# Patient Record
Sex: Female | Born: 1960
Health system: Southern US, Community
[De-identification: ages and names within clinical notes are randomized; demographics above are authoritative.]

## PROBLEM LIST (undated history)

## (undated) DIAGNOSIS — I639 Cerebral infarction, unspecified: Secondary | ICD-10-CM

---

## 2001-01-06 ENCOUNTER — Other Ambulatory Visit: Admission: RE | Admit: 2001-01-06 | Discharge: 2001-01-06 | Payer: Self-pay | Admitting: Obstetrics and Gynecology

## 2001-04-04 ENCOUNTER — Ambulatory Visit (HOSPITAL_COMMUNITY): Admission: RE | Admit: 2001-04-04 | Discharge: 2001-04-04 | Payer: Self-pay | Admitting: Obstetrics and Gynecology

## 2001-04-04 ENCOUNTER — Encounter: Payer: Self-pay | Admitting: Obstetrics and Gynecology

## 2001-08-21 ENCOUNTER — Inpatient Hospital Stay (HOSPITAL_COMMUNITY): Admission: AD | Admit: 2001-08-21 | Discharge: 2001-08-24 | Payer: Self-pay | Admitting: Obstetrics and Gynecology

## 2002-04-03 ENCOUNTER — Other Ambulatory Visit: Admission: RE | Admit: 2002-04-03 | Discharge: 2002-04-03 | Payer: Self-pay | Admitting: Obstetrics and Gynecology

## 2003-02-10 ENCOUNTER — Encounter: Payer: Self-pay | Admitting: Obstetrics and Gynecology

## 2003-02-10 ENCOUNTER — Encounter: Admission: RE | Admit: 2003-02-10 | Discharge: 2003-02-10 | Payer: Self-pay | Admitting: Obstetrics and Gynecology

## 2003-09-02 ENCOUNTER — Other Ambulatory Visit: Admission: RE | Admit: 2003-09-02 | Discharge: 2003-09-02 | Payer: Self-pay | Admitting: Obstetrics and Gynecology

## 2004-02-15 ENCOUNTER — Encounter: Admission: RE | Admit: 2004-02-15 | Discharge: 2004-02-15 | Payer: Self-pay | Admitting: Obstetrics and Gynecology

## 2004-11-22 ENCOUNTER — Other Ambulatory Visit: Admission: RE | Admit: 2004-11-22 | Discharge: 2004-11-22 | Payer: Self-pay | Admitting: Obstetrics and Gynecology

## 2005-02-19 ENCOUNTER — Encounter: Admission: RE | Admit: 2005-02-19 | Discharge: 2005-02-19 | Payer: Self-pay | Admitting: Obstetrics and Gynecology

## 2006-02-25 ENCOUNTER — Encounter: Admission: RE | Admit: 2006-02-25 | Discharge: 2006-02-25 | Payer: Self-pay | Admitting: Obstetrics and Gynecology

## 2006-03-13 ENCOUNTER — Other Ambulatory Visit: Admission: RE | Admit: 2006-03-13 | Discharge: 2006-03-13 | Payer: Self-pay | Admitting: Obstetrics and Gynecology

## 2007-07-15 ENCOUNTER — Encounter: Admission: RE | Admit: 2007-07-15 | Discharge: 2007-07-15 | Payer: Self-pay | Admitting: Obstetrics and Gynecology

## 2008-07-27 ENCOUNTER — Encounter: Admission: RE | Admit: 2008-07-27 | Discharge: 2008-07-27 | Payer: Self-pay | Admitting: Obstetrics and Gynecology

## 2008-12-30 ENCOUNTER — Encounter: Admission: RE | Admit: 2008-12-30 | Discharge: 2008-12-30 | Payer: Self-pay | Admitting: Internal Medicine

## 2010-09-24 ENCOUNTER — Encounter: Payer: Self-pay | Admitting: Obstetrics and Gynecology

## 2011-01-19 NOTE — H&P (Signed)
[facility]  PatientLibertie Casey, [place] Visit Number: [id number] MRN: [id number]          Service Type: Attending:  Naima A. Normand Sloop, M.D. Dictated by:   Nigel Bridgeman, C.N.M. Adm. Date:  08/21/01                           History and Physical  HISTORY:                      Michelle Casey is a 50 year old gravida 2, para 1-0-0-1 at 34 1/7 weeks who presents today in labor from the office.  She was seen in the office earlier today and was 2 cm.  She was rechecked later this afternoon and was now 3-4.  She had a small amount of bleeding this morning. Pregnancy has been remarkable for first trimester bleeding, history of hyperthyroidism, history of postpartum depression, fibroid, advanced maternal age.  PRENATAL LABORATORIES:        Blood type AB+.  Rh antibody negative.  VDRL nonreactive.  Rubella titer positive.  Hepatitis B surface antigen negative. Sickle cell test negative.  GC and chlamydia cultures were negative.  Pap was normal.  TSH was normal.  Glucose challenge was normal.  Amniocentesis was normal.  Group B STrep culture was negative at 36 weeks.  HISTORY OF PRESENT PREGNANCY: Patient entered care at approximately 10 weeks. She elected to have an amniocentesis.  Her TSHs were within normal limits. She had a marginal previa at the time of her amniocentesis.  Her amniocentesis showed normal female chromosomes.  She had another ultrasound at 21 weeks that showed normal growth and development.  She had a 5 cm fibroid noted.  The rest of her pregnancy was uncomplicated.  PAST OBSTETRICAL HISTORY:     In 1998 she had the vaginal birth of a female infant.  Weight 8 pounds 14 ounces at [redacted] weeks gestation.  She was in labor 12 hours.  She had a vaginal birth.  She had some first trimester bleeding.  She also had some postpartum depression that lasted one month.  PAST MEDICAL HISTORY:         She was a condom user.  She has a history of hyperthyroidism  diagnosed in 43.  She has been on medications on and off, last time was in 1998.  She does have a history of postpartum depression for one month.  Only other hospitalization was for childbirth.  ALLERGIES:                    She has sensitivity to some type of hyperthyroid medicine which causes her itching.  FAMILY HISTORY:               Her father is hypertensive.  Her father also had prostate and bladder cancer.  Her father was a previous smoker.  GENETIC HISTORY:              Remarkable for the father of the baby having aplastic anemia in 1989.  SOCIAL HISTORY:               Patient is married to the father of the baby. He is involved and supportive.  His name is Manfred Arch.  Patient is [place], of the [place] faith.  She speaks [place], [place], and Air cabin crew.  There is minimal language barrier.  She is graduated educated.  She is employed as a  coordinator.  Her husband also has a Naval architect.  Patient denies any alcohol, drug, or tobacco use during this pregnancy.  She has been followed by the physician service at [facility].  PHYSICAL EXAMINATION  VITAL SIGNS:                  Vital signs are stable.  Patient is afebrile.  HEENT:                        Within normal limits.  LUNGS:                        Bilateral breath sounds are clear.  HEART:                        Regular rate and rhythm without murmur.  BREASTS:                      Soft and nontender.  ABDOMEN:                      Fundal height is approximately 38 cm.  Estimated fetal weight is 8-8.5 pounds.  Uterine contractions are every three to five minutes, moderate quality.  PELVIC:                       Cervix 3-4 cm by Wynelle Bourgeois in the office with vertex presenting.  Fetal heart rate is in the 140s by Doppler.  EXTREMITIES:                  Deep tendon reflexes are 2+ without clonus. There is a trace edema noted.  IMPRESSION:                   1. Intrauterine pregnancy at 39  1/7 weeks.                               2. Early labor.                               3. Negative group B Strep.  PLAN:                         1. Admit to birthing suite for consult with Dr.                                  Jaymes Graff as attending physician.                               2. Routine physician orders.                               3. Anticipate normal spontaneous vaginal birth. Dictated by:   Nigel Bridgeman, C.N.M. Attending:  Naima A. Normand Sloop, M.D. DD:  08/21/01 TD:  08/21/01 Job: [id number]

## 2011-03-30 ENCOUNTER — Other Ambulatory Visit: Payer: Self-pay | Admitting: Obstetrics and Gynecology

## 2011-03-30 DIAGNOSIS — Z1231 Encounter for screening mammogram for malignant neoplasm of breast: Secondary | ICD-10-CM

## 2013-06-17 ENCOUNTER — Other Ambulatory Visit: Payer: Self-pay | Admitting: Obstetrics and Gynecology

## 2013-06-17 DIAGNOSIS — Z803 Family history of malignant neoplasm of breast: Secondary | ICD-10-CM

## 2014-04-28 ENCOUNTER — Other Ambulatory Visit: Payer: Self-pay

## 2014-04-29 ENCOUNTER — Other Ambulatory Visit: Payer: Self-pay

## 2014-04-29 DIAGNOSIS — Z1231 Encounter for screening mammogram for malignant neoplasm of breast: Secondary | ICD-10-CM

## 2014-05-07 ENCOUNTER — Ambulatory Visit
Admission: RE | Admit: 2014-05-07 | Discharge: 2014-05-07 | Disposition: A | Discharge status: Home / Self Care | Payer: PRIVATE HEALTH INSURANCE | Source: Ambulatory Visit

## 2014-05-07 DIAGNOSIS — Z1231 Encounter for screening mammogram for malignant neoplasm of breast: Secondary | ICD-10-CM

## 2014-05-14 ENCOUNTER — Other Ambulatory Visit: Payer: Self-pay | Admitting: Gastroenterology

## 2014-06-30 ENCOUNTER — Other Ambulatory Visit: Payer: Self-pay | Admitting: Obstetrics and Gynecology

## 2014-07-02 LAB — CYTOLOGY - PAP

## 2015-03-30 ENCOUNTER — Encounter (INDEPENDENT_AMBULATORY_CARE_PROVIDER_SITE_OTHER): Payer: Self-pay | Admitting: Ophthalmology

## 2015-03-31 ENCOUNTER — Encounter (INDEPENDENT_AMBULATORY_CARE_PROVIDER_SITE_OTHER): Payer: Self-pay | Admitting: Ophthalmology

## 2015-03-31 ENCOUNTER — Encounter (INDEPENDENT_AMBULATORY_CARE_PROVIDER_SITE_OTHER): Payer: Commercial Managed Care - PPO | Admitting: Ophthalmology

## 2015-03-31 DIAGNOSIS — H33302 Unspecified retinal break, left eye: Secondary | ICD-10-CM | POA: Diagnosis not present

## 2015-03-31 DIAGNOSIS — H43813 Vitreous degeneration, bilateral: Secondary | ICD-10-CM | POA: Diagnosis not present

## 2015-03-31 DIAGNOSIS — H4423 Degenerative myopia, bilateral: Secondary | ICD-10-CM

## 2015-04-14 ENCOUNTER — Ambulatory Visit (INDEPENDENT_AMBULATORY_CARE_PROVIDER_SITE_OTHER): Payer: Commercial Managed Care - PPO | Admitting: Ophthalmology

## 2015-04-14 DIAGNOSIS — H33302 Unspecified retinal break, left eye: Secondary | ICD-10-CM

## 2015-08-15 ENCOUNTER — Ambulatory Visit (INDEPENDENT_AMBULATORY_CARE_PROVIDER_SITE_OTHER): Payer: Commercial Managed Care - PPO | Admitting: Ophthalmology

## 2018-02-14 ENCOUNTER — Encounter (INDEPENDENT_AMBULATORY_CARE_PROVIDER_SITE_OTHER): Payer: Commercial Managed Care - PPO | Admitting: Ophthalmology

## 2018-02-14 DIAGNOSIS — H4423 Degenerative myopia, bilateral: Secondary | ICD-10-CM

## 2018-02-14 DIAGNOSIS — H43813 Vitreous degeneration, bilateral: Secondary | ICD-10-CM

## 2018-02-14 DIAGNOSIS — H33302 Unspecified retinal break, left eye: Secondary | ICD-10-CM

## 2019-02-27 ENCOUNTER — Encounter (INDEPENDENT_AMBULATORY_CARE_PROVIDER_SITE_OTHER): Payer: Commercial Managed Care - PPO | Admitting: Ophthalmology

## 2019-12-18 ENCOUNTER — Ambulatory Visit: Payer: PRIVATE HEALTH INSURANCE | Attending: Internal Medicine

## 2019-12-18 DIAGNOSIS — Z23 Encounter for immunization: Secondary | ICD-10-CM

## 2019-12-18 NOTE — Progress Notes (Signed)
   Covid-19 Vaccination Clinic  Name:  Eryn Lough    MRN: [id number] DOB: 06-Oct-1960  12/18/2019  Ms. Dabney was observed post Covid-19 immunization for 15 minutes without incident. She was provided with Vaccine Information Sheet and instruction to access the V-Safe system.   Ms. Brunn was instructed to call 911 with any severe reactions post vaccine: Marland Kitchen Difficulty breathing  . Swelling of face and throat  . A fast heartbeat  . A bad rash all over body  . Dizziness and weakness   Immunizations Administered    Name Date Dose VIS Date Route   Pfizer COVID-19 Vaccine 12/18/2019  9:15 AM 0.3 mL 08/14/2019 Intramuscular   Manufacturer: [employer], [employer]   Lot: [id number]   NDC: [address]-8

## 2020-01-11 ENCOUNTER — Ambulatory Visit: Payer: PRIVATE HEALTH INSURANCE | Attending: Internal Medicine

## 2020-01-11 DIAGNOSIS — Z23 Encounter for immunization: Secondary | ICD-10-CM

## 2020-01-11 NOTE — Progress Notes (Signed)
   Covid-19 Vaccination Clinic  Name:  Claritza Tappan    MRN: [id number] DOB: 1961/06/08  01/11/2020  Ms. Ketcher was observed post Covid-19 immunization for 15 minutes without incident. She was provided with Vaccine Information Sheet and instruction to access the V-Safe system.   Ms. Coppola was instructed to call 911 with any severe reactions post vaccine: Marland Kitchen Difficulty breathing  . Swelling of face and throat  . A fast heartbeat  . A bad rash all over body  . Dizziness and weakness   Immunizations Administered    Name Date Dose VIS Date Route   Pfizer COVID-19 Vaccine 01/11/2020  9:34 AM 0.3 mL 10/28/2018 Intramuscular   Manufacturer: [employer], [employer]   Lot: [id number]   NDC: [address]-2

## 2020-05-16 DIAGNOSIS — E039 Hypothyroidism, unspecified: Secondary | ICD-10-CM | POA: Diagnosis present

## 2020-05-16 HISTORY — DX: Hypothyroidism, unspecified: E03.9

## 2021-01-19 ENCOUNTER — Encounter (HOSPITAL_BASED_OUTPATIENT_CLINIC_OR_DEPARTMENT_OTHER): Payer: Self-pay | Admitting: *Deleted

## 2021-01-19 ENCOUNTER — Other Ambulatory Visit: Payer: Self-pay

## 2021-01-19 DIAGNOSIS — Z8673 Personal history of transient ischemic attack (TIA), and cerebral infarction without residual deficits: Secondary | ICD-10-CM

## 2021-01-19 DIAGNOSIS — R7989 Other specified abnormal findings of blood chemistry: Secondary | ICD-10-CM | POA: Diagnosis present

## 2021-01-19 DIAGNOSIS — E871 Hypo-osmolality and hyponatremia: Secondary | ICD-10-CM | POA: Diagnosis present

## 2021-01-19 DIAGNOSIS — E860 Dehydration: Secondary | ICD-10-CM | POA: Diagnosis present

## 2021-01-19 DIAGNOSIS — U071 COVID-19: Principal | ICD-10-CM | POA: Diagnosis present

## 2021-01-19 DIAGNOSIS — I959 Hypotension, unspecified: Secondary | ICD-10-CM | POA: Diagnosis present

## 2021-01-19 DIAGNOSIS — E063 Autoimmune thyroiditis: Secondary | ICD-10-CM | POA: Diagnosis present

## 2021-01-19 DIAGNOSIS — E039 Hypothyroidism, unspecified: Secondary | ICD-10-CM | POA: Diagnosis present

## 2021-01-19 DIAGNOSIS — Z888 Allergy status to other drugs, medicaments and biological substances status: Secondary | ICD-10-CM

## 2021-01-19 DIAGNOSIS — Z79890 Hormone replacement therapy (postmenopausal): Secondary | ICD-10-CM

## 2021-01-19 NOTE — ED Triage Notes (Signed)
Pt reports Covid + home test today , daughter at home with same, fever , chills

## 2021-01-20 ENCOUNTER — Encounter (HOSPITAL_COMMUNITY): Payer: Self-pay | Admitting: Family Medicine

## 2021-01-20 ENCOUNTER — Inpatient Hospital Stay (HOSPITAL_BASED_OUTPATIENT_CLINIC_OR_DEPARTMENT_OTHER)
Admission: EM | Admit: 2021-01-20 | Discharge: 2021-01-24 | DRG: 178 | Disposition: A | Discharge status: Home / Self Care | Payer: Managed Care, Other (non HMO) | Attending: Internal Medicine | Admitting: Internal Medicine

## 2021-01-20 ENCOUNTER — Emergency Department (HOSPITAL_BASED_OUTPATIENT_CLINIC_OR_DEPARTMENT_OTHER): Payer: Managed Care, Other (non HMO)

## 2021-01-20 DIAGNOSIS — E860 Dehydration: Secondary | ICD-10-CM | POA: Diagnosis present

## 2021-01-20 DIAGNOSIS — R790 Abnormal level of blood mineral: Secondary | ICD-10-CM | POA: Diagnosis not present

## 2021-01-20 DIAGNOSIS — E063 Autoimmune thyroiditis: Secondary | ICD-10-CM | POA: Diagnosis present

## 2021-01-20 DIAGNOSIS — E039 Hypothyroidism, unspecified: Secondary | ICD-10-CM

## 2021-01-20 DIAGNOSIS — Z8673 Personal history of transient ischemic attack (TIA), and cerebral infarction without residual deficits: Secondary | ICD-10-CM | POA: Diagnosis not present

## 2021-01-20 DIAGNOSIS — I959 Hypotension, unspecified: Secondary | ICD-10-CM | POA: Diagnosis present

## 2021-01-20 DIAGNOSIS — E871 Hypo-osmolality and hyponatremia: Secondary | ICD-10-CM | POA: Diagnosis present

## 2021-01-20 DIAGNOSIS — U071 COVID-19: Secondary | ICD-10-CM | POA: Diagnosis present

## 2021-01-20 DIAGNOSIS — Z888 Allergy status to other drugs, medicaments and biological substances status: Secondary | ICD-10-CM | POA: Diagnosis not present

## 2021-01-20 DIAGNOSIS — Z79890 Hormone replacement therapy (postmenopausal): Secondary | ICD-10-CM | POA: Diagnosis not present

## 2021-01-20 DIAGNOSIS — R7989 Other specified abnormal findings of blood chemistry: Secondary | ICD-10-CM | POA: Diagnosis present

## 2021-01-20 DIAGNOSIS — R945 Abnormal results of liver function studies: Secondary | ICD-10-CM

## 2021-01-20 HISTORY — DX: Autoimmune thyroiditis: E06.3

## 2021-01-20 HISTORY — DX: Cerebral infarction, unspecified: I63.9

## 2021-01-20 LAB — BASIC METABOLIC PANEL
Anion gap: 12 (ref 5–15)
Anion gap: 5 (ref 5–15)
Anion gap: 9 (ref 5–15)
Anion gap: 5 (ref 5–15)
BUN: 11 mg/dL (ref 6–20)
BUN: 9 mg/dL (ref 6–20)
BUN: 8 mg/dL (ref 6–20)
BUN: 9 mg/dL (ref 6–20)
CO2: 17 mmol/L — ABNORMAL LOW (ref 22–32)
CO2: 20 mmol/L — ABNORMAL LOW (ref 22–32)
CO2: 15 mmol/L — ABNORMAL LOW (ref 22–32)
CO2: 22 mmol/L (ref 22–32)
Calcium: 8.9 mg/dL (ref 8.9–10.3)
Calcium: 8.1 mg/dL — ABNORMAL LOW (ref 8.9–10.3)
Calcium: 8.1 mg/dL — ABNORMAL LOW (ref 8.9–10.3)
Calcium: 8.2 mg/dL — ABNORMAL LOW (ref 8.9–10.3)
Chloride: 85 mmol/L — ABNORMAL LOW (ref 98–111)
Chloride: 95 mmol/L — ABNORMAL LOW (ref 98–111)
Chloride: 90 mmol/L — ABNORMAL LOW (ref 98–111)
Chloride: 92 mmol/L — ABNORMAL LOW (ref 98–111)
Creatinine, Ser: 0.86 mg/dL (ref 0.44–1.00)
Creatinine, Ser: 0.6 mg/dL (ref 0.44–1.00)
Creatinine, Ser: 0.46 mg/dL (ref 0.44–1.00)
Creatinine, Ser: 0.7 mg/dL (ref 0.44–1.00)
GFR, Estimated: >60 mL/min (ref >60)
GFR, Estimated: >60 mL/min (ref >60)
GFR, Estimated: >60 mL/min (ref >60)
GFR, Estimated: >60 mL/min (ref >60)
Glucose, Bld: 85 mg/dL (ref 70–99)
Glucose, Bld: 87 mg/dL (ref 70–99)
Glucose, Bld: 80 mg/dL (ref 70–99)
Glucose, Bld: 88 mg/dL (ref 70–99)
Potassium: 3.6 mmol/L (ref 3.5–5.1)
Potassium: 3.7 mmol/L (ref 3.5–5.1)
Potassium: 3.7 mmol/L (ref 3.5–5.1)
Potassium: 3.5 mmol/L (ref 3.5–5.1)
Sodium: 114 mmol/L — CL (ref 135–145)
Sodium: 120 mmol/L — ABNORMAL LOW (ref 135–145)
Sodium: 114 mmol/L — CL (ref 135–145)
Sodium: 119 mmol/L — CL (ref 135–145)

## 2021-01-20 LAB — HIV ANTIBODY (ROUTINE TESTING W REFLEX): HIV Screen 4th Generation wRfx: NONREACTIVE

## 2021-01-20 LAB — FIBRINOGEN: Fibrinogen: 506 mg/dL — ABNORMAL HIGH (ref 210–475)

## 2021-01-20 LAB — CBC WITH DIFFERENTIAL/PLATELET
Abs Immature Granulocytes: 0.01 10*3/uL (ref 0.00–0.07)
Basophils Absolute: 0 10*3/uL (ref 0.0–0.1)
Basophils Relative: 0 %
Eosinophils Absolute: 0 10*3/uL (ref 0.0–0.5)
Eosinophils Relative: 1 %
HCT: 37.2 % (ref 36.0–46.0)
Hemoglobin: 13.4 g/dL (ref 12.0–15.0)
Immature Granulocytes: 0 %
Lymphocytes Relative: 25 %
Lymphs Abs: 1.4 10*3/uL (ref 0.7–4.0)
MCH: 29.9 pg (ref 26.0–34.0)
MCHC: 36 g/dL (ref 30.0–36.0)
MCV: 83 fL (ref 80.0–100.0)
Monocytes Absolute: 0.9 10*3/uL (ref 0.1–1.0)
Monocytes Relative: 15 %
Neutro Abs: 3.3 10*3/uL (ref 1.7–7.7)
Neutrophils Relative %: 59 %
Platelets: 210 10*3/uL (ref 150–400)
RBC: 4.48 MIL/uL (ref 3.87–5.11)
RDW: 11.8 % (ref 11.5–15.5)
WBC: 5.6 10*3/uL (ref 4.0–10.5)
nRBC: 0 % (ref 0.0–0.2)

## 2021-01-20 LAB — IRON AND TIBC
Iron: 32 ug/dL (ref 28–170)
Saturation Ratios: 11 % (ref 10.4–31.8)
TIBC: 300 ug/dL (ref 250–450)
UIBC: 268 ug/dL

## 2021-01-20 LAB — SODIUM, URINE, RANDOM: Sodium, Ur: 86 mmol/L

## 2021-01-20 LAB — HEPATIC FUNCTION PANEL
ALT: 31 U/L (ref 0–44)
AST: 25 U/L (ref 15–41)
Albumin: 3.3 g/dL — ABNORMAL LOW (ref 3.5–5.0)
Alkaline Phosphatase: 83 U/L (ref 38–126)
Bilirubin, Direct: 0.1 mg/dL (ref 0.0–0.2)
Indirect Bilirubin: 0.7 mg/dL (ref 0.3–0.9)
Total Bilirubin: 0.8 mg/dL (ref 0.3–1.2)
Total Protein: 5.6 g/dL — ABNORMAL LOW (ref 6.5–8.1)

## 2021-01-20 LAB — LACTATE DEHYDROGENASE: LDH: 172 U/L (ref 98–192)

## 2021-01-20 LAB — OSMOLALITY: Osmolality: 245 mOsm/kg — CL (ref 275–295)

## 2021-01-20 LAB — BLOOD GAS, VENOUS
Acid-base deficit: 5.1 mmol/L — ABNORMAL HIGH (ref 0.0–2.0)
Bicarbonate: 18.2 mmol/L — ABNORMAL LOW (ref 20.0–28.0)
FIO2: 21
O2 Saturation: 45.2 %
Patient temperature: 98
pCO2, Ven: 29.6 mmHg — ABNORMAL LOW (ref 44.0–60.0)
pH, Ven: 7.404 (ref 7.250–7.430)
pO2, Ven: <31 mmHg — CL (ref 32.0–45.0)

## 2021-01-20 LAB — C-REACTIVE PROTEIN: CRP: 0.5 mg/dL (ref <1.0)

## 2021-01-20 LAB — RESP PANEL BY RT-PCR (FLU A&B, COVID) ARPGX2
Influenza A by PCR: NEGATIVE
Influenza B by PCR: NEGATIVE
SARS Coronavirus 2 by RT PCR: POSITIVE — AB

## 2021-01-20 LAB — OSMOLALITY, URINE: Osmolality, Ur: 408 mOsm/kg (ref 300–900)

## 2021-01-20 LAB — TRIGLYCERIDES: Triglycerides: 77 mg/dL (ref <150)

## 2021-01-20 LAB — PREGNANCY, URINE: Preg Test, Ur: NEGATIVE

## 2021-01-20 LAB — D-DIMER, QUANTITATIVE: D-Dimer, Quant: 0.31 ug/mL-FEU (ref 0.00–0.50)

## 2021-01-20 LAB — FERRITIN: Ferritin: 216 ng/mL (ref 11–307)

## 2021-01-20 LAB — TSH: TSH: 1.049 u[IU]/mL (ref 0.350–4.500)

## 2021-01-20 LAB — LACTIC ACID, PLASMA: Lactic Acid, Venous: 0.9 mmol/L (ref 0.5–1.9)

## 2021-01-20 LAB — PROCALCITONIN: Procalcitonin: <0.1 ng/mL

## 2021-01-20 MED ORDER — SODIUM CHLORIDE 0.9 % IV SOLN: INTRAVENOUS | Status: DC

## 2021-01-20 MED ORDER — ACETAMINOPHEN 325 MG PO TABS
650.0000 mg | ORAL_TABLET | Freq: Once | ORAL | Status: AC
  Administered 2021-01-20: 650 mg via ORAL
  Filled 2021-01-20: qty 2

## 2021-01-20 MED ORDER — SODIUM CHLORIDE 0.9 % IV BOLUS
1000.0000 mL | Freq: Once | INTRAVENOUS | Status: AC
  Administered 2021-01-20: 1000 mL via INTRAVENOUS

## 2021-01-20 MED ORDER — ENOXAPARIN SODIUM 40 MG/0.4ML IJ SOSY
40.0000 mg | PREFILLED_SYRINGE | INTRAMUSCULAR | Status: DC
  Administered 2021-01-20 – 2021-01-24 (×5): 40 mg via SUBCUTANEOUS
  Filled 2021-01-20 (×5): qty 0.4

## 2021-01-20 MED ORDER — ENSURE ENLIVE PO LIQD: 237.0000 mL | Freq: Two times a day (BID) | ORAL | Status: DC

## 2021-01-20 MED ORDER — IBUPROFEN 400 MG PO TABS
600.0000 mg | ORAL_TABLET | Freq: Once | ORAL | Status: AC
  Administered 2021-01-20: 600 mg via ORAL
  Filled 2021-01-20: qty 1

## 2021-01-20 MED ORDER — ONDANSETRON HCL 4 MG PO TABS
4.0000 mg | ORAL_TABLET | Freq: Four times a day (QID) | ORAL | Status: DC | PRN
  Administered 2021-01-20: 4 mg via ORAL
  Filled 2021-01-20: qty 1

## 2021-01-20 MED ORDER — SODIUM CHLORIDE 0.9 % IV SOLN
100.0000 mg | Freq: Once | INTRAVENOUS | Status: AC
  Administered 2021-01-20: 100 mg via INTRAVENOUS

## 2021-01-20 MED ORDER — LEVOTHYROXINE SODIUM 100 MCG PO TABS
100.0000 ug | ORAL_TABLET | Freq: Every day | ORAL | Status: DC
  Administered 2021-01-20 – 2021-01-24 (×5): 100 ug via ORAL
  Filled 2021-01-20 (×5): qty 1

## 2021-01-20 MED ORDER — ACETAMINOPHEN 325 MG PO TABS
650.0000 mg | ORAL_TABLET | Freq: Four times a day (QID) | ORAL | Status: DC | PRN
  Administered 2021-01-20 – 2021-01-21 (×3): 650 mg via ORAL
  Filled 2021-01-20 (×3): qty 2

## 2021-01-20 MED ORDER — ONDANSETRON HCL 4 MG/2ML IJ SOLN: 4.0000 mg | Freq: Four times a day (QID) | INTRAMUSCULAR | Status: DC | PRN

## 2021-01-20 MED ORDER — GUAIFENESIN-DM 100-10 MG/5ML PO SYRP: 10.0000 mL | ORAL_SOLUTION | ORAL | Status: DC | PRN

## 2021-01-20 MED ORDER — FLUTICASONE PROPIONATE 50 MCG/ACT NA SUSP
2.0000 | Freq: Every day | NASAL | Status: DC
  Administered 2021-01-20 – 2021-01-24 (×5): 2 via NASAL
  Filled 2021-01-20: qty 16

## 2021-01-20 MED ORDER — ALBUTEROL SULFATE HFA 108 (90 BASE) MCG/ACT IN AERS: 2.0000 | INHALATION_SPRAY | RESPIRATORY_TRACT | Status: DC | PRN

## 2021-01-20 MED ORDER — ZINC SULFATE 220 (50 ZN) MG PO CAPS
220.0000 mg | ORAL_CAPSULE | Freq: Every day | ORAL | Status: DC
  Administered 2021-01-20 – 2021-01-24 (×5): 220 mg via ORAL
  Filled 2021-01-20 (×5): qty 1

## 2021-01-20 MED ORDER — MELATONIN 5 MG PO TABS: 10.0000 mg | ORAL_TABLET | Freq: Every evening | ORAL | Status: DC | PRN

## 2021-01-20 MED ORDER — ENSURE ENLIVE PO LIQD
237.0000 mL | Freq: Two times a day (BID) | ORAL | Status: DC
  Administered 2021-01-20 – 2021-01-23 (×7): 237 mL via ORAL

## 2021-01-20 MED ORDER — ADULT MULTIVITAMIN W/MINERALS CH
1.0000 | ORAL_TABLET | Freq: Every day | ORAL | Status: DC
  Administered 2021-01-20 – 2021-01-24 (×5): 1 via ORAL
  Filled 2021-01-20 (×5): qty 1

## 2021-01-20 MED ORDER — SODIUM CHLORIDE 0.9 % IV SOLN
100.0000 mg | INTRAVENOUS | Status: AC
  Administered 2021-01-20: 100 mg via INTRAVENOUS

## 2021-01-20 MED ORDER — SODIUM CHLORIDE 0.9 % IV SOLN
100.0000 mg | Freq: Every day | INTRAVENOUS | Status: AC
  Administered 2021-01-21 – 2021-01-22 (×2): 100 mg via INTRAVENOUS
  Filled 2021-01-20 (×2): qty 20

## 2021-01-20 MED ORDER — PROSOURCE PLUS PO LIQD
30.0000 mL | Freq: Every day | ORAL | Status: DC
  Administered 2021-01-20 – 2021-01-23 (×3): 30 mL via ORAL
  Filled 2021-01-20 (×2): qty 30

## 2021-01-20 MED ORDER — SODIUM BICARBONATE 650 MG PO TABS
650.0000 mg | ORAL_TABLET | Freq: Three times a day (TID) | ORAL | Status: DC
  Administered 2021-01-20 (×2): 650 mg via ORAL
  Filled 2021-01-20 (×2): qty 1

## 2021-01-20 MED ORDER — POLYETHYLENE GLYCOL 3350 17 G PO PACK: 17.0000 g | PACK | Freq: Every day | ORAL | Status: DC | PRN

## 2021-01-20 MED ORDER — ASCORBIC ACID 500 MG PO TABS
500.0000 mg | ORAL_TABLET | Freq: Every day | ORAL | Status: DC
  Administered 2021-01-20 – 2021-01-24 (×5): 500 mg via ORAL
  Filled 2021-01-20 (×5): qty 1

## 2021-01-20 MED ORDER — MENTHOL 3 MG MT LOZG
1.0000 | LOZENGE | OROMUCOSAL | Status: DC | PRN
  Administered 2021-01-20: 3 mg via ORAL
  Filled 2021-01-20: qty 9

## 2021-01-20 NOTE — H&P (Signed)
History and Physical    Tommy Connon KVQ:[id number] DOB: [birth year] DOA: 01/20/2021  PCP: [facility] [place]  Patient coming from: [facility]   Chief Complaint:  Chief Complaint  Patient presents with  . Covid Positive     HPI:    60 year old female with past medical history of hypothyroidism who presents to med [facility] emergency department with complaints of shortness of breath myalgias and weakness.  Patient explains that she picked her daughter up from college this past Saturday and after doing so realized that her daughter was exhibiting  symptoms concerning for COVID.  They performed a home test on her daughter on Sunday which was found to be positive.  Patient explains that by Wednesday 5/18 the patient began to experience generalized weakness, myalgias, nonproductive cough and nausea with frequent bouts of nonbilious nonbloody vomiting.  Patient also complains of associated shortness of breath is moderate to severe in intensity worse with exertion and improved with rest.  As patient symptoms persisted patient also began to develop tingling in the digits of her bilateral hands as well as a resting tremor.  Due to this constellation of symptoms the patient eventually presented to med [facility] emergency department for evaluation.  Upon evaluation at [facility] emergency department, COVID-19 testing was found to be positive.  Surprisingly, patient was also found to exhibit substantial hyponatremia of 114.  ER provider ordered a 1 L fluid bolus which was subsequently administered.  Patient was also initiated on intravenous remdesivir.  The hospitalist group was then called and the patient was accepted for transfer to [facility] long hospital for continued medical care.    Review of Systems:   Review of Systems  Constitutional: Positive for malaise/fatigue and weight loss.  Respiratory: Positive for cough and shortness of breath.    Musculoskeletal: Positive for joint pain and myalgias.  Neurological: Positive for tingling, tremors and weakness.  All other systems reviewed and are negative.   Past Medical History:  Diagnosis Date  . Autoimmune thyroiditis 01/20/2021  . Hypothyroidism 05/16/2020  . Stroke [facility])     History reviewed. No pertinent surgical history.   reports that she has never smoked. She has never used smokeless tobacco. No history on file for alcohol use and drug use.  Allergies  Allergen Reactions  . Carbimazole Itching and Rash    Family History  Problem Relation Age of Onset  . Heart attack Neg Hx      Prior to Admission medications   Medication Sig Start Date End Date Taking? Authorizing Provider  levothyroxine (SYNTHROID) 100 MCG tablet Take 100 mcg by mouth daily.    [provider]    Physical Exam: Vitals:   01/20/21 0401 01/20/21 0413 01/20/21 0500 01/20/21 0509  BP: (!) 103/49   (!) 126/92  Pulse: 66   63  Resp: 19   18  Temp:  98.2 F (36.8 C)  98.4 F (36.9 C)  TempSrc:  Oral  Oral  SpO2: 100%   100%  Weight:   60.7 kg   Height:   5\' 7"  (1.702 m)     Constitutional: Awake alert and oriented x3, no associated distress.   Skin: no rashes, no lesions, somewhat poor skin turgor noted. Eyes: Pupils are equally reactive to light.  No evidence of scleral icterus or conjunctival pallor.  ENMT: Dry mucous membranes noted.  Posterior pharynx clear of any exudate or lesions.   Neck: normal, supple, no masses, no thyromegaly.  No evidence of jugular venous distension.   Respiratory: Faint bibasilar rales without any evidence of wheezing.   Normal respiratory effort. No accessory muscle use.  Cardiovascular: Regular rate and rhythm, no murmurs / rubs / gallops. No extremity edema. 2+ pedal pulses. No carotid bruits.  Chest:   Nontender without crepitus or deformity.   Back:   Nontender without crepitus or deformity. Abdomen: Abdomen is soft and nontender.  No  evidence of intra-abdominal masses.  Positive bowel sounds noted in all quadrants.   Musculoskeletal: No joint deformity upper and lower extremities. Good ROM, no contractures. Normal muscle tone.  Neurologic: CN 2-12 grossly intact. Sensation intact.  Patient moving all 4 extremities spontaneously.  Patient is following all commands.  Patient is responsive to verbal stimuli.   Psychiatric: Patient exhibits anxious mood with labile affect.  Patient seems to possess insight as to their current situation.     Labs on Admission: I have personally reviewed following labs and imaging studies -   CBC: Recent Labs  Lab 01/20/21 0132  WBC 5.6  NEUTROABS 3.3  HGB 13.4  HCT 37.2  MCV 83.0  PLT 210   Basic Metabolic Panel: Recent Labs  Lab 01/20/21 0132 01/20/21 0604  NA 114* 120*  K 3.6 3.7  CL 85* 95*  CO2 17* 20*  GLUCOSE 85 87  BUN 11 9  CREATININE 0.86 0.60  CALCIUM 8.9 8.1*   GFR: Estimated Creatinine Clearance: 74.3 mL/min (by C-G formula based on SCr of 0.6 mg/dL). Liver Function Tests: Recent Labs  Lab 01/20/21 0303  AST 25  ALT 31  ALKPHOS 83  BILITOT 0.8  PROT 5.6*  ALBUMIN 3.3*   No results for input(s): LIPASE, AMYLASE in the last 168 hours. No results for input(s): AMMONIA in the last 168 hours. Coagulation Profile: No results for input(s): INR, PROTIME in the last 168 hours. Cardiac Enzymes: No results for input(s): CKTOTAL, CKMB, CKMBINDEX, TROPONINI in the last 168 hours. BNP (last 3 results) No results for input(s): PROBNP in the last 8760 hours. HbA1C: No results for input(s): HGBA1C in the last 72 hours. CBG: No results for input(s): GLUCAP in the last 168 hours. Lipid Profile: Recent Labs    01/20/21 0132  TRIG 77   Thyroid Function Tests: Recent Labs    01/20/21 0604  TSH 1.049   Anemia Panel: Recent Labs    01/20/21 0303  FERRITIN 216   Urine analysis: No results found for: COLORURINE, APPEARANCEUR, LABSPEC, PHURINE, GLUCOSEU,  HGBUR, BILIRUBINUR, KETONESUR, PROTEINUR, UROBILINOGEN, NITRITE, LEUKOCYTESUR  Radiological Exams on Admission - Personally Reviewed: DG Chest Port 1 View  Result Date: 01/20/2021 CLINICAL DATA:  cough EXAM: PORTABLE CHEST 1 VIEW COMPARISON:  None. FINDINGS: The heart size and mediastinal contours are within normal limits. No focal consolidation. No visible pleural effusion or pneumothorax. The visualized skeletal structures are unremarkable. IMPRESSION: No acute cardiopulmonary disease. Electronically Signed   By: Maudry Mayhew MD   On: 01/20/2021 02:56    EKG: Personally reviewed.  Rhythm is normal sinus rhythm with heart rate of 66 bpm.  No dynamic ST segment changes appreciated.  Assessment/Plan Principal Problem:   Hyponatremia  Patient reports that she was seen by one of her outpatient providers approximately 2 weeks ago and at that time was incidentally found to have a sodium of 131.  I have visually confirmed this test result after reviewing the results on her phone.  In the past several days the patient has exhibited particularly poor oral intake  and appetite resulting in generalized weakness tremors and occasional tingling of the hands  Sodium has now surprisingly been found to be 114 upon arrival to med [facility] emergency department.  Patient has already been administered a 1 L bolus of isotonic fluids by the emergency department staff with repeat chemistry performed on arrival to our medical unit revealing the sodium level has jumped up to 120.Marland Kitchen    Considering patient only has a target rate of correction of approximately 8 over the span of 24 hours holding additional fluids for now to avoid placing the patient at risk of central pontine moniliasis.  As the day progresses if sodium levels remain stable via serial chemistries will consider initiation of gentle fluids later in the day.  Serum osmolality, urine osmolality, urine sodium and TSH have been ordered for work-up  of hyponatremia  Active Problems:   COVID-19 virus infection  While patient is symptomatic with complaints of shortness of breath, cough and myalgias considering the lack of hypoxia patient's case is relatively mild  We will go ahead and provide patient with a 3-day course of intravenous remdesivir, first dose is already been given at the med [facility] emergency department.  I do not believe that steroids are indicated at this point.  As needed antitussives for cough  As needed bronchodilators for shortness of breath or wheezing  Zinc and vitamin C supplementation  Will provide submental oxygen to maintain oxygen saturations of greater than 94%  Airborne and contact precautions    Hypothyroidism   Considering significant hyponatremia will obtain TSH  Continue home regimen of levothyroxine    Abnormal LFTs   Patient reports that her outpatient provider told her that she had elevated LFTs here for routine labs 2 weeks ago.  I have visually confirmed that patient did indeed have a slightly elevated AST and ALT 2 weeks ago, both of which were around 100.  Patient also underwent a hepatitis viral panel all of which was negative.  I visually confirmed with results as well.  Hepatic function panel performed here on arrival reveals that elevated AST and ALT have resolved.  Abdominal exam is benign.  No evidence of liver disease on physical exam.    Raised serum iron   Patient additionally reports that her outpatient provider told her she had a "elevated iron."  Also reports that she has noticed that her skin has become darker in the past several weeks to months.  Will obtain ferritin and iron panel and determine if this requires any further work-up -likely as an outpatient.   Code Status:  Full code Family Communication: deferred   Status is: Inpatient  Remains inpatient appropriate because:Ongoing diagnostic testing needed not appropriate for outpatient work up,  IV treatments appropriate due to intensity of illness or inability to take PO and Inpatient level of care appropriate due to severity of illness   Dispo: The patient is from: Home              Anticipated d/c is to: Home              Patient currently is not medically stable to d/c.   Difficult to place patient No        Marinda Elk MD [facility] Pager [phone]  If 7PM-7AM, please contact night-coverage [email or web address] Use universal [place] password for that web site. If you do not have the password, please call the hospital operator.  01/20/2021, 7:34 AM

## 2021-01-20 NOTE — Progress Notes (Signed)
Results for [name], [name] (MRN [id number]) as of 01/20/2021 09:23  Ref. Range 01/20/2021 06:04  Sodium Latest Ref Range: 135 - 145 mmol/L 120 (L)  Potassium Latest Ref Range: 3.5 - 5.1 mmol/L 3.7  BUN Latest Ref Range: 6 - 20 mg/dL 9  Creatinine Latest Ref Range: 0.44 - 1.00 mg/dL 1.16  Osmolality Latest Ref Range: 275 - 295 mOsm/kg 245 (LL)    MD updated.  SRP,RN

## 2021-01-20 NOTE — Progress Notes (Signed)
   01/20/21 3710  Provider Notification  Provider Name/Title Shauna Hugh, MD  Date Provider Notified 01/20/21  Time Provider Notified [phone]  Notification Type Face-to-face  Notification Reason Critical result  Test performed and critical result ABG pO2 = <31  Date Critical Result Received 01/20/21  Time Critical Result Received 0600  Provider response At bedside  Date of Provider Response 01/20/21  Time of Provider Response [phone]

## 2021-01-20 NOTE — ED Provider Notes (Addendum)
[facility] EMERGENCY DEPARTMENT Provider Note   CSN: [id number] Arrival date & time: 01/19/21  2307     History Chief Complaint  Patient presents with  . Covid Positive    Michelle Casey is a 60 y.o. female.  Patient presents with 2-day history of cough congestion body aches and fever.  Daughter has a similar symptoms.  Patient has been vaccinated for COVID last December.  Also complaining of some shortness of breath.  No diarrhea.  Patient has had several episodes of vomiting at home today as well.        Past Medical History:  Diagnosis Date  . Stroke [facility])     Patient Active Problem List   Diagnosis Date Noted  . COVID-19 virus infection 01/20/2021    History reviewed. No pertinent surgical history.   OB History   No obstetric history on file.     No family history on file.     Home Medications Prior to Admission medications   Not on File    Allergies    Carbimazole  Review of Systems   Review of Systems  Constitutional: Positive for fever.  HENT: Negative for ear pain.   Eyes: Negative for pain.  Respiratory: Positive for cough.   Cardiovascular: Negative for chest pain.  Gastrointestinal: Negative for abdominal pain.  Genitourinary: Negative for flank pain.  Musculoskeletal: Negative for back pain.  Skin: Negative for rash.  Neurological: Negative for headaches.    Physical Exam Updated Vital Signs BP (!) 103/49   Pulse 66   Temp 98.2 F (36.8 C) (Oral)   Resp 19   SpO2 100%   Physical Exam Constitutional:      General: She is not in acute distress.    Appearance: Normal appearance.  HENT:     Head: Normocephalic.     Nose: Nose normal.  Eyes:     Extraocular Movements: Extraocular movements intact.  Cardiovascular:     Rate and Rhythm: Normal rate.  Pulmonary:     Effort: Pulmonary effort is normal.  Musculoskeletal:        General: Normal range of motion.     Cervical back: Normal range of motion.  Neurological:      General: No focal deficit present.     Mental Status: She is alert. Mental status is at baseline.     ED Results / Procedures / Treatments   Labs (all labs ordered are listed, but only abnormal results are displayed) Labs Reviewed  RESP PANEL BY RT-PCR (FLU A&B, COVID) ARPGX2 - Abnormal; Notable for the following components:      Result Value   SARS Coronavirus 2 by RT PCR POSITIVE (*)    All other components within normal limits  BASIC METABOLIC PANEL - Abnormal; Notable for the following components:   Sodium 114 (*)    Chloride 85 (*)    CO2 17 (*)    All other components within normal limits  HEPATIC FUNCTION PANEL - Abnormal; Notable for the following components:   Total Protein 5.6 (*)    Albumin 3.3 (*)    All other components within normal limits  CULTURE, BLOOD (ROUTINE X 2)  CULTURE, BLOOD (ROUTINE X 2)  CBC WITH DIFFERENTIAL/PLATELET  D-DIMER, QUANTITATIVE  PREGNANCY, URINE  PROCALCITONIN  LACTATE DEHYDROGENASE  FERRITIN  TRIGLYCERIDES  FIBRINOGEN  C-REACTIVE PROTEIN    EKG EKG Interpretation  Date/Time:  Friday Lamija 20 2022 03:47:45 EDT Ventricular Rate:  66 PR Interval:  206 QRS Duration: 75  QT Interval:  482 QTC Calculation: 506 R Axis:   87 Text Interpretation: Sinus rhythm Borderline prolonged PR interval Nonspecific T abnrm, anterolateral leads ST elevation, consider inferior injury Borderline prolonged QT interval Confirmed by Norman Clay ([id number]) on 01/20/2021 3:54:53 AM   Radiology DG Chest Port 1 View  Result Date: 01/20/2021 CLINICAL DATA:  cough EXAM: PORTABLE CHEST 1 VIEW COMPARISON:  None. FINDINGS: The heart size and mediastinal contours are within normal limits. No focal consolidation. No visible pleural effusion or pneumothorax. The visualized skeletal structures are unremarkable. IMPRESSION: No acute cardiopulmonary disease. Electronically Signed   By: Maudry Mayhew MD   On: 01/20/2021 02:56    Procedures .Critical Care E&M Performed  by: Cheryll Cockayne, MD  Critical care provider statement:    Critical care time (minutes):  30   Critical care time was exclusive of:  Separately billable procedures and treating other patients   Critical care was necessary to treat or prevent imminent or life-threatening deterioration of the following conditions:  Metabolic crisis After initial E/M assessment, critical care services were subsequently performed that were exclusive of separately billable procedures or treatment.   Comments:     Severe hyponatremia     Medications Ordered in ED Medications  remdesivir 100 mg in sodium chloride 0.9 % 100 mL IVPB (has no administration in time range)  acetaminophen (TYLENOL) tablet 650 mg (650 mg Oral Given 01/20/21 0120)  ibuprofen (ADVIL) tablet 600 mg (600 mg Oral Given 01/20/21 0120)  sodium chloride 0.9 % bolus 1,000 mL ( Intravenous Stopped 01/20/21 0235)  remdesivir 100 mg in sodium chloride 0.9 % 100 mL IVPB (0 mg Intravenous Stopped 01/20/21 0400)    Followed by  remdesivir 100 mg in sodium chloride 0.9 % 100 mL IVPB (100 mg Intravenous New Bag/Given 01/20/21 0402)    ED Course  I have reviewed the triage vital signs and the nursing notes.  Pertinent labs & imaging results that were available during my care of the patient were reviewed by me and considered in my medical decision making (see chart for details).    MDM Rules/Calculators/A&P                          Labs show significantly low sodium levels.  Patient given a liter bolus of IV fluid hydration.  She is COVID-positive here, given her hyponatremia, will be brought into the hospitalist team.  Final Clinical Impression(s) / ED Diagnoses Final diagnoses:  COVID-19 virus infection  Hyponatremia    Rx / DC Orders ED Discharge Orders    None       Cheryll Cockayne, MD 01/20/21 8182    Cheryll Cockayne, MD 01/20/21 [phone]

## 2021-01-20 NOTE — ED Notes (Signed)
Spoke with patient's spouse; update on plan of care given; advised patient will be admitted. Verbalized understanding.

## 2021-01-20 NOTE — ED Notes (Signed)
Patient placed on 2L [place] to maintain oxygen saturation of > than 90%. Patient tolerating well.

## 2021-01-20 NOTE — ED Notes (Signed)
Dr Audley Hose spoke with patient's spouse and updated on status.

## 2021-01-20 NOTE — Progress Notes (Signed)
Pt with temperature of 100.8 F. Given PRN Tylenol. Will continue to monitor.

## 2021-01-20 NOTE — Progress Notes (Signed)
   Brief same day note:   Patient is a 60 year old female with history of hypothyroidism who presented at [facility] with complaint of shortness of breath, nausea, weakness.  She was tested for COVID on 5/15 and it was positive.  She started feeling more weak, started having cough, nausea so she presented to the emergency department.  On presentation, COVID screen test was positive.  Sodium level was found to be 114.  She was admitted for further management.  Started on IV remdesivir.  Not started on steroids because she was not hypoxic. Patient seen and examined at the bedside this morning.  She complains of nasal congestion today.  She was on 2 L of oxygen per minute, complains of some cough.  Sodium level is improved to 120 today from 114, IV fluids have been discontinued to prevent rapid overcorrection. Auscultation did not reveal any crackles or wheezes.  Continue zinc and vitamin C. Will continue current management plan. Called and discussed with the husband on phone today for the update.

## 2021-01-20 NOTE — Progress Notes (Signed)
Initial Nutrition Assessment  DOCUMENTATION CODES:   Not applicable  INTERVENTION:  - continue Ensure Enlive BID, each supplement provides 350 kcal and 20 grams of protein. - will order 30 ml Prosource Plus once/day, each supplement provides 100 kcal and 15 grams protein.  - will order 1 tablet multivitamin with minerals/day.    NUTRITION DIAGNOSIS:   Increased nutrient needs related to acute illness,catabolic illness (COVID-19 infection) as evidenced by estimated needs.  GOAL:   Patient will meet greater than or equal to 90% of their needs  MONITOR:   PO intake,Supplement acceptance,Labs,Weight trends  REASON FOR ASSESSMENT:   Malnutrition Screening Tool  ASSESSMENT:   60 year old female with medical history of hypothyroidism. She presented to [facility] ED with complaints of shortness of breath, myalgias, weakness, N/V, and non-productive cough; symptoms began on 5/18. She picked her daughter up from college and daughter then tested positive for COVID-19. In the ED, patient tested positive for COVID-19 and found to have serum Na of 114 mmol/l.  She consumed 5% of breakfast this AM. Ensure Enlive ordered BID per ONS protocol; she has not been offered this supplement yet today.   Weight today is 134 lb and no other weight recordings available in the chart. No information documented in the edema section of flow sheet.   Per notes: - hyponatremia--ongoing for at least 2 weeks PTA - COVID-19 infection - abnormal LFTs--hepatitis viral panel outpatient was negative; elevated LFTs have now resolved   Labs reviewed; Na: 120 mmol/l, Cl: 95 mmol/l, Ca: 8.1 mg/dl. Medications reviewed; 500 mg ascorbic acid/day, 100 mcg oral synthroid/day, 100 mg IV remdesivir x2 doses 5/20, 100 mg IV remdesivir x1 dose/day 5/21-5/24, 220 mg zinc sulfate/day.     NUTRITION - FOCUSED PHYSICAL EXAM:  unable to complete at this time.   Diet Order:   Diet Order            Diet regular  Room service appropriate? Yes; Fluid consistency: Thin  Diet effective now                 EDUCATION NEEDS:   No education needs have been identified at this time  Skin:  Skin Assessment: Reviewed RN Assessment  Last BM:  5/18  Height:   Ht Readings from Last 1 Encounters:  01/20/21 5\' 7"  (1.702 m)    Weight:   Wt Readings from Last 1 Encounters:  01/20/21 60.7 kg    Estimated Nutritional Needs:  Kcal:  1825-2010 kcal Protein:  95-110 grams Fluid:  >/= 2.2 L/day      01/22/21, MS, RD, LDN, CNSC Inpatient Clinical Dietitian RD pager # available in [facility]  After hours/weekend pager # available in [facility]

## 2021-01-20 NOTE — Progress Notes (Signed)
   01/20/21 2300  Provider Notification  Provider Name/Title Audrea Muscat, NP  Date Provider Notified 01/20/21  Time Provider Notified 2315  Notification Type Page  Notification Reason Critical result  Test performed and critical result Sodium 119  Date Critical Result Received 01/20/21  Time Critical Result Received 2230  Provider response No new orders

## 2021-01-20 NOTE — ED Notes (Signed)
Spoke to [employer] at [place]. Placed consult for hospitalist for admission.

## 2021-01-21 DIAGNOSIS — E871 Hypo-osmolality and hyponatremia: Secondary | ICD-10-CM | POA: Diagnosis not present

## 2021-01-21 LAB — CBC WITH DIFFERENTIAL/PLATELET
Abs Immature Granulocytes: 0.01 10*3/uL (ref 0.00–0.07)
Basophils Absolute: 0 10*3/uL (ref 0.0–0.1)
Basophils Relative: 0 %
Eosinophils Absolute: 0 10*3/uL (ref 0.0–0.5)
Eosinophils Relative: 1 %
HCT: 37 % (ref 36.0–46.0)
Hemoglobin: 12.8 g/dL (ref 12.0–15.0)
Immature Granulocytes: 0 %
Lymphocytes Relative: 33 %
Lymphs Abs: 1.3 10*3/uL (ref 0.7–4.0)
MCH: 30.1 pg (ref 26.0–34.0)
MCHC: 34.6 g/dL (ref 30.0–36.0)
MCV: 87.1 fL (ref 80.0–100.0)
Monocytes Absolute: 0.7 10*3/uL (ref 0.1–1.0)
Monocytes Relative: 18 %
Neutro Abs: 1.9 10*3/uL (ref 1.7–7.7)
Neutrophils Relative %: 48 %
Platelets: 194 10*3/uL (ref 150–400)
RBC: 4.25 MIL/uL (ref 3.87–5.11)
RDW: 12.2 % (ref 11.5–15.5)
WBC: 3.9 10*3/uL — ABNORMAL LOW (ref 4.0–10.5)
nRBC: 0 % (ref 0.0–0.2)

## 2021-01-21 LAB — MAGNESIUM: Magnesium: 2 mg/dL (ref 1.7–2.4)

## 2021-01-21 LAB — COMPREHENSIVE METABOLIC PANEL
ALT: 26 U/L (ref 0–44)
AST: 21 U/L (ref 15–41)
Albumin: 3.3 g/dL — ABNORMAL LOW (ref 3.5–5.0)
Alkaline Phosphatase: 74 U/L (ref 38–126)
Anion gap: 7 (ref 5–15)
BUN: 9 mg/dL (ref 6–20)
CO2: 21 mmol/L — ABNORMAL LOW (ref 22–32)
Calcium: 8.6 mg/dL — ABNORMAL LOW (ref 8.9–10.3)
Chloride: 98 mmol/L (ref 98–111)
Creatinine, Ser: 0.7 mg/dL (ref 0.44–1.00)
GFR, Estimated: >60 mL/min (ref >60)
Glucose, Bld: 84 mg/dL (ref 70–99)
Potassium: 3.7 mmol/L (ref 3.5–5.1)
Sodium: 126 mmol/L — ABNORMAL LOW (ref 135–145)
Total Bilirubin: 0.5 mg/dL (ref 0.3–1.2)
Total Protein: 5.9 g/dL — ABNORMAL LOW (ref 6.5–8.1)

## 2021-01-21 LAB — D-DIMER, QUANTITATIVE: D-Dimer, Quant: 0.45 ug/mL-FEU (ref 0.00–0.50)

## 2021-01-21 LAB — C-REACTIVE PROTEIN: CRP: 1 mg/dL — ABNORMAL HIGH (ref <1.0)

## 2021-01-21 MED ORDER — PHENOL 1.4 % MT LIQD: 1.0000 | OROMUCOSAL | Status: DC | PRN

## 2021-01-21 NOTE — Progress Notes (Signed)
PROGRESS NOTE    Michelle Casey  NGE:[id number] DOB: [birth year] DOA: 01/20/2021 PCP: Daisy Floro, MD   Chief Complain: Shortness of breath, nausea, weakness  Brief Narrative: Patient is a 60 year old female with history of hypothyroidism who presented at [facility] with complaint of shortness of breath, nausea, weakness.  She was tested for COVID on 5/15 and it was positive.  She started feeling more weak, started having cough, nausea so she presented to the emergency department.  On presentation, COVID screen test was positive.  Sodium level was found to be 114.  She was admitted for further management.  Started on IV remdesivir.  Not started on steroids because she was not hypoxic.  Hospital course remarkable for fever  Assessment & Plan:   Principal Problem:   Hyponatremia Active Problems:   COVID-19 virus infection   Hypothyroidism   Abnormal LFTs   Raised serum iron   COVID illness: Not hypoxic.  Chest x-ray did not show pneumonia.  Started on remdesivir.  Currently on room air.  She was mildly febrile last night.  Again febrile this morning.  Blood cultures have been sent. Continue to monitor inflammatory markers, incentive spirometer, flutter valve, bronchodilators as needed. Fever is most likely from COVID illness.  Will check procalcitonin.  Denies any dysuria.  Hyponatremia: Most likely secondary to dehydration.  Responded well to the IV fluids.  Sodium jumped from 114 yesterday to 126 today so IV fluids stopped to prevent over correction  Hypothyroidism: on Synthyroid  Sore throat: Continue Cepacol lozenges, spray    Nutrition Problem: Increased nutrient needs Etiology: acute illness,catabolic illness (COVID-19 infection)      DVT prophylaxis:Lovenox Code Status: Full Family Communication:: Discussed with husband on 01/20/21  Status is: Inpatient  Remains inpatient appropriate because:Inpatient level of care appropriate due to severity of  illness   Dispo: The patient is from: Home              Anticipated d/c is to: Home              Patient currently is not medically stable to d/c.   Difficult to place patient No      Consultants: None  Procedures:None  Antimicrobials:  Anti-infectives (From admission, onward)   Start     Dose/Rate Route Frequency Ordered Stop   01/21/21 1000  remdesivir 100 mg in sodium chloride 0.9 % 100 mL IVPB        100 mg 200 mL/hr over 30 Minutes Intravenous Daily 01/20/21 0259 01/23/21 0959   01/20/21 0400  remdesivir 100 mg in sodium chloride 0.9 % 100 mL IVPB       "Followed by" Linked Group Details   100 mg 200 mL/hr over 30 Minutes Intravenous  Once 01/20/21 0258 01/20/21 0557   01/20/21 0330  remdesivir 100 mg in sodium chloride 0.9 % 100 mL IVPB       "Followed by" Linked Group Details   100 mg 200 mL/hr over 30 Minutes Intravenous NOW 01/20/21 0258 01/20/21 0400      Subjective:  Patient seen and examined the bedside this morning.  Medically stable.  She looks better today but complains of sore throat and had fever this morning.   Objective: Vitals:   01/20/21 1302 01/20/21 2121 01/21/21 0057 01/21/21 0524  BP: 112/66 103/62  (!) 99/53  Pulse: 83 73  75  Resp: 18 20  18   Temp: 98.5 F (36.9 C) (!) 100.8 F (38.2 C) (!) 100.4  F (38 C) 99.6 F (37.6 C)  TempSrc: Oral Oral Oral Oral  SpO2: 99% 98%  100%  Weight:      Height:        Intake/Output Summary (Last 24 hours) at 01/21/2021 1610 Last data filed at 01/21/2021 0600 Gross per 24 hour  Intake 2170.51 ml  Output --  Net 2170.51 ml   Filed Weights   01/20/21 0500  Weight: 60.7 kg    Examination:  General exam: Overall comfortable, not in distress HEENT: PERRL Respiratory system:  no wheezes or crackles  Cardiovascular system: S1 & S2 heard, RRR.  Gastrointestinal system: Abdomen is nondistended, soft and nontender. Central nervous system: Alert and oriented Extremities: No edema, no clubbing ,no  cyanosis Skin: No rashes, no ulcers,no icterus .     Data Reviewed: I have personally reviewed following labs and imaging studies  CBC: Recent Labs  Lab 01/20/21 0132 01/21/21 0347  WBC 5.6 3.9*  NEUTROABS 3.3 1.9  HGB 13.4 12.8  HCT 37.2 37.0  MCV 83.0 87.1  PLT 210 194   Basic Metabolic Panel: Recent Labs  Lab 01/20/21 0132 01/20/21 0604 01/20/21 1230 01/20/21 2014 01/21/21 0347  NA 114* 120* 114* 119* 126*  K 3.6 3.7 3.7 3.5 3.7  CL 85* 95* 90* 92* 98  CO2 17* 20* 15* 22 21*  GLUCOSE 85 87 80 88 84  BUN 11 9 8 9 9   CREATININE 0.86 0.60 0.46 0.70 0.70  CALCIUM 8.9 8.1* 8.1* 8.2* 8.6*  MG  --   --   --   --  2.0   GFR: Estimated Creatinine Clearance: 74.3 mL/min (by C-G formula based on SCr of 0.7 mg/dL). Liver Function Tests: Recent Labs  Lab 01/20/21 0303 01/21/21 0347  AST 25 21  ALT 31 26  ALKPHOS 83 74  BILITOT 0.8 0.5  PROT 5.6* 5.9*  ALBUMIN 3.3* 3.3*   No results for input(s): LIPASE, AMYLASE in the last 168 hours. No results for input(s): AMMONIA in the last 168 hours. Coagulation Profile: No results for input(s): INR, PROTIME in the last 168 hours. Cardiac Enzymes: No results for input(s): CKTOTAL, CKMB, CKMBINDEX, TROPONINI in the last 168 hours. BNP (last 3 results) No results for input(s): PROBNP in the last 8760 hours. HbA1C: No results for input(s): HGBA1C in the last 72 hours. CBG: No results for input(s): GLUCAP in the last 168 hours. Lipid Profile: Recent Labs    01/20/21 0132  TRIG 77   Thyroid Function Tests: Recent Labs    01/20/21 0604  TSH 1.049   Anemia Panel: Recent Labs    01/20/21 0303 01/20/21 0604  FERRITIN 216  --   TIBC  --  300  IRON  --  32   Sepsis Labs: Recent Labs  Lab 01/20/21 0132 01/20/21 0604  PROCALCITON <0.10  --   LATICACIDVEN  --  0.9    Recent Results (from the past 240 hour(s))  Resp Panel by RT-PCR (Flu A&B, Covid) Nasopharyngeal Swab     Status: Abnormal   Collection Time:  01/19/21 11:32 PM   Specimen: Nasopharyngeal Swab; Nasopharyngeal(NP) swabs in vial transport medium  Result Value Ref Range Status   SARS Coronavirus 2 by RT PCR POSITIVE (A) NEGATIVE Final    Comment: RESULT CALLED TO, READ BACK BY AND VERIFIED WITH: L. ADKINS,CHARGE RN 01/21/21 01/20/2021 T. TYSOR (NOTE) SARS-CoV-2 target nucleic acids are DETECTED.  The SARS-CoV-2 RNA is generally detectable in upper respiratory specimens during the acute phase of  infection. Positive results are indicative of the presence of the identified virus, but do not rule out bacterial infection or co-infection with other pathogens not detected by the test. Clinical correlation with patient history and other diagnostic information is necessary to determine patient infection status. The expected result is Negative.  Fact Sheet for Patients: BloggerCourse.com  Fact Sheet for Healthcare Providers: SeriousBroker.it  This test is not yet approved or cleared by the [place] FDA and  has been authorized for detection and/or diagnosis of SARS-CoV-2 by FDA under an Emergency Use Authorization (EUA).  This EUA will remain in effect (meaning this  test can be used) for the duration of  the COVID-19 declaration under Section 564(b)(1) of the Act, 21 U.S.C. section 360bbb-3(b)(1), unless the authorization is terminated or revoked sooner.     Influenza A by PCR NEGATIVE NEGATIVE Final   Influenza B by PCR NEGATIVE NEGATIVE Final    Comment: (NOTE) The Xpert Xpress SARS-CoV-2/FLU/RSV plus assay is intended as an aid in the diagnosis of influenza from Nasopharyngeal swab specimens and should not be used as a sole basis for treatment. Nasal washings and aspirates are unacceptable for Xpert Xpress SARS-CoV-2/FLU/RSV testing.  Fact Sheet for Patients: BloggerCourse.com  Fact Sheet for Healthcare  Providers: SeriousBroker.it  This test is not yet approved or cleared by the [place] FDA and has been authorized for detection and/or diagnosis of SARS-CoV-2 by FDA under an Emergency Use Authorization (EUA). This EUA will remain in effect (meaning this test can be used) for the duration of the COVID-19 declaration under Section 564(b)(1) of the Act, 21 U.S.C. section 360bbb-3(b)(1), unless the authorization is terminated or revoked.  Performed at [facility], [address], [place], [place] [address]          Radiology Studies: DG Chest [place] 1 View  Result Date: 01/20/2021 CLINICAL DATA:  cough EXAM: PORTABLE CHEST 1 VIEW COMPARISON:  None. FINDINGS: The heart size and mediastinal contours are within normal limits. No focal consolidation. No visible pleural effusion or pneumothorax. The visualized skeletal structures are unremarkable. IMPRESSION: No acute cardiopulmonary disease. Electronically Signed   By: Maudry Mayhew MD   On: 01/20/2021 02:56        Scheduled Meds: . (feeding supplement) PROSource Plus  30 mL Oral Daily  . vitamin C  500 mg Oral Daily  . enoxaparin (LOVENOX) injection  40 mg Subcutaneous Q24H  . feeding supplement  237 mL Oral BID BM  . fluticasone  2 spray Each Nare Daily  . levothyroxine  100 mcg Oral Q0600  . multivitamin with minerals  1 tablet Oral Daily  . sodium bicarbonate  650 mg Oral TID  . zinc sulfate  220 mg Oral Daily   Continuous Infusions: . remdesivir 100 mg in NS 100 mL       LOS: 1 day    Time spent: 25 mins.More than 50% of that time was spent in counseling and/or coordination of care.      Burnadette Pop, MD [facility] P5/21/2022, 8:21 AM

## 2021-01-21 NOTE — Plan of Care (Signed)

## 2021-01-21 NOTE — Plan of Care (Signed)
  Problem: Education: Goal: Knowledge of General Education information will improve Description: Including pain rating scale, medication(s)/side effects and non-pharmacologic comfort measures Outcome: Progressing   Problem: Health Behavior/Discharge Planning: Goal: Ability to manage health-related needs will improve Outcome: Progressing   Problem: Clinical Measurements: Goal: Diagnostic test results will improve Outcome: Progressing   Problem: Activity: Goal: Risk for activity intolerance will decrease Outcome: Progressing   Problem: Nutrition: Goal: Adequate nutrition will be maintained Outcome: Progressing   Problem: Coping: Goal: Level of anxiety will decrease Outcome: Progressing   Problem: Pain Managment: Goal: General experience of comfort will improve Outcome: Progressing

## 2021-01-22 DIAGNOSIS — E871 Hypo-osmolality and hyponatremia: Secondary | ICD-10-CM | POA: Diagnosis not present

## 2021-01-22 LAB — CBC WITH DIFFERENTIAL/PLATELET
Abs Immature Granulocytes: 0 10*3/uL (ref 0.00–0.07)
Basophils Absolute: 0 10*3/uL (ref 0.0–0.1)
Basophils Relative: 0 %
Eosinophils Absolute: 0.1 10*3/uL (ref 0.0–0.5)
Eosinophils Relative: 3 %
HCT: 39.1 % (ref 36.0–46.0)
Hemoglobin: 13.4 g/dL (ref 12.0–15.0)
Immature Granulocytes: 0 %
Lymphocytes Relative: 49 %
Lymphs Abs: 1.5 10*3/uL (ref 0.7–4.0)
MCH: 29.8 pg (ref 26.0–34.0)
MCHC: 34.3 g/dL (ref 30.0–36.0)
MCV: 87.1 fL (ref 80.0–100.0)
Monocytes Absolute: 0.4 10*3/uL (ref 0.1–1.0)
Monocytes Relative: 15 %
Neutro Abs: 1 10*3/uL — ABNORMAL LOW (ref 1.7–7.7)
Neutrophils Relative %: 33 %
Platelets: 212 10*3/uL (ref 150–400)
RBC: 4.49 MIL/uL (ref 3.87–5.11)
RDW: 12.7 % (ref 11.5–15.5)
WBC: 3 10*3/uL — ABNORMAL LOW (ref 4.0–10.5)
nRBC: 0 % (ref 0.0–0.2)

## 2021-01-22 LAB — MAGNESIUM: Magnesium: 2.2 mg/dL (ref 1.7–2.4)

## 2021-01-22 LAB — COMPREHENSIVE METABOLIC PANEL
ALT: 41 U/L (ref 0–44)
AST: 41 U/L (ref 15–41)
Albumin: 3.6 g/dL (ref 3.5–5.0)
Alkaline Phosphatase: 90 U/L (ref 38–126)
Anion gap: 10 (ref 5–15)
BUN: 13 mg/dL (ref 6–20)
CO2: 22 mmol/L (ref 22–32)
Calcium: 9.1 mg/dL (ref 8.9–10.3)
Chloride: 97 mmol/L — ABNORMAL LOW (ref 98–111)
Creatinine, Ser: 0.79 mg/dL (ref 0.44–1.00)
GFR, Estimated: >60 mL/min (ref >60)
Glucose, Bld: 94 mg/dL (ref 70–99)
Potassium: 3.7 mmol/L (ref 3.5–5.1)
Sodium: 129 mmol/L — ABNORMAL LOW (ref 135–145)
Total Bilirubin: 0.2 mg/dL — ABNORMAL LOW (ref 0.3–1.2)
Total Protein: 6.5 g/dL (ref 6.5–8.1)

## 2021-01-22 LAB — D-DIMER, QUANTITATIVE: D-Dimer, Quant: 0.47 ug/mL-FEU (ref 0.00–0.50)

## 2021-01-22 LAB — C-REACTIVE PROTEIN: CRP: 1.5 mg/dL — ABNORMAL HIGH (ref <1.0)

## 2021-01-22 MED ORDER — SODIUM CHLORIDE 0.9 % IV SOLN: INTRAVENOUS | Status: DC

## 2021-01-22 MED ORDER — POLYETHYLENE GLYCOL 3350 17 G PO PACK
17.0000 g | PACK | Freq: Two times a day (BID) | ORAL | Status: DC
  Administered 2021-01-22: 17 g via ORAL
  Filled 2021-01-22 (×4): qty 1

## 2021-01-22 MED ORDER — SODIUM CHLORIDE 0.9 % IV BOLUS
500.0000 mL | Freq: Once | INTRAVENOUS | Status: AC
  Administered 2021-01-22: 500 mL via INTRAVENOUS

## 2021-01-22 MED ORDER — POLYVINYL ALCOHOL 1.4 % OP SOLN
1.0000 [drp] | OPHTHALMIC | Status: DC | PRN
  Administered 2021-01-22: 1 [drp] via OPHTHALMIC
  Filled 2021-01-22: qty 15

## 2021-01-22 NOTE — Plan of Care (Signed)

## 2021-01-22 NOTE — Progress Notes (Signed)
PROGRESS NOTE    Michelle Casey  WUJ:[id number] DOB: 1960-12-17 DOA: 01/20/2021 PCP: Daisy Floro, MD   Chief Complain: Shortness of breath, nausea, weakness  Brief Narrative: Patient is a 60 year old female with history of hypothyroidism who presented at [facility] with complaint of shortness of breath, nausea, weakness.  She was tested for COVID on 5/15 and it was positive.  She started feeling more weak, started having cough, nausea so she presented to the emergency department.  On presentation, COVID screen test was positive.  Sodium level was found to be 114.  She was admitted for further management.  Started on IV remdesivir.  Not started on steroids because she was not hypoxic.  Hospital course remarkable for fever,now resolved  Assessment & Plan:   Principal Problem:   Hyponatremia Active Problems:   COVID-19 virus infection   Hypothyroidism   Abnormal LFTs   Raised serum iron   COVID illness: Not hypoxic.  Chest x-ray did not show pneumonia.  Started on remdesivir,day 3/5.  Currently on room air.  She was febrile intermittently during his hospitalization, afebrile today.  Blood cultures have been sent, no growth till date Continue to monitor inflammatory markers, incentive spirometer, flutter valve, bronchodilators as needed. Fever is most likely from COVID illness.   Hyponatremia: Most likely secondary to dehydration.  Responded well to the IV fluids.  Continue IV fluids.  Hypotension: Patient blood pressure has been soft consistently.  We will give him a bolus of 500 mL of normal saline and started on maintenance IV fluids.  Hypothyroidism: on Synthyroid  Sore throat: Continue Cepacol lozenges, spray.MUch better today    Nutrition Problem: Increased nutrient needs Etiology: acute illness,catabolic illness (COVID-19 infection)      DVT prophylaxis:Lovenox Code Status: Full Family Communication:: Discussed with husband on 01/20/21  Status is:  Inpatient  Remains inpatient appropriate because:Inpatient level of care appropriate due to severity of illness   Dispo: The patient is from: Home              Anticipated d/c is to: Home              Patient currently is not medically stable to d/c.   Difficult to place patient No      Consultants: None  Procedures:None  Antimicrobials:  Anti-infectives (From admission, onward)   Start     Dose/Rate Route Frequency Ordered Stop   01/21/21 1000  remdesivir 100 mg in sodium chloride 0.9 % 100 mL IVPB        100 mg 200 mL/hr over 30 Minutes Intravenous Daily 01/20/21 0259 01/23/21 0959   01/20/21 0400  remdesivir 100 mg in sodium chloride 0.9 % 100 mL IVPB       "Followed by" Linked Group Details   100 mg 200 mL/hr over 30 Minutes Intravenous  Once 01/20/21 0258 01/20/21 0557   01/20/21 0330  remdesivir 100 mg in sodium chloride 0.9 % 100 mL IVPB       "Followed by" Linked Group Details   100 mg 200 mL/hr over 30 Minutes Intravenous NOW 01/20/21 0258 01/20/21 0400      Subjective:  Patient seen and examined the bedside this morning.  Hemodynamically stable but blood pressure is soft.  Complains of some dizziness.  No cough or shortness of breath.  Weakness is better  Objective: Vitals:   01/21/21 1347 01/21/21 2037 01/22/21 0442 01/22/21 0520  BP: 98/64 98/61 (!) 103/58 (!) 97/57  Pulse: 75 74 76  73  Resp: 16 16 20 16   Temp: 98.5 F (36.9 C) 98.4 F (36.9 C) 98.3 F (36.8 C) 98.8 F (37.1 C)  TempSrc: Oral Oral Oral Oral  SpO2: 100% 97% 98% 97%  Weight:      Height:       No intake or output data in the 24 hours ending 01/22/21 0751 Filed Weights   01/20/21 0500  Weight: 60.7 kg    Examination:  General exam: Overall comfortable, not in distress HEENT: PERRL Respiratory system:  no wheezes or crackles  Cardiovascular system: S1 & S2 heard, RRR.  Gastrointestinal system: Abdomen is nondistended, soft and nontender. Central nervous system: Alert and  oriented Extremities: No edema, no clubbing ,no cyanosis Skin: No rashes, no ulcers,no icterus .   Data Reviewed: I have personally reviewed following labs and imaging studies  CBC: Recent Labs  Lab 01/20/21 0132 01/21/21 0347 01/22/21 0406  WBC 5.6 3.9* 3.0*  NEUTROABS 3.3 1.9 1.0*  HGB 13.4 12.8 13.4  HCT 37.2 37.0 39.1  MCV 83.0 87.1 87.1  PLT 210 194 212   Basic Metabolic Panel: Recent Labs  Lab 01/20/21 0604 01/20/21 1230 01/20/21 2014 01/21/21 0347 01/22/21 0406  NA 120* 114* 119* 126* 129*  K 3.7 3.7 3.5 3.7 3.7  CL 95* 90* 92* 98 97*  CO2 20* 15* 22 21* 22  GLUCOSE 87 80 88 84 94  BUN 9 8 9 9 13   CREATININE 0.60 0.46 0.70 0.70 0.79  CALCIUM 8.1* 8.1* 8.2* 8.6* 9.1  MG  --   --   --  2.0 2.2   GFR: Estimated Creatinine Clearance: 74.3 mL/min (by C-G formula based on SCr of 0.79 mg/dL). Liver Function Tests: Recent Labs  Lab 01/20/21 0303 01/21/21 0347 01/22/21 0406  AST 25 21 41  ALT 31 26 41  ALKPHOS 83 74 90  BILITOT 0.8 0.5 0.2*  PROT 5.6* 5.9* 6.5  ALBUMIN 3.3* 3.3* 3.6   No results for input(s): LIPASE, AMYLASE in the last 168 hours. No results for input(s): AMMONIA in the last 168 hours. Coagulation Profile: No results for input(s): INR, PROTIME in the last 168 hours. Cardiac Enzymes: No results for input(s): CKTOTAL, CKMB, CKMBINDEX, TROPONINI in the last 168 hours. BNP (last 3 results) No results for input(s): PROBNP in the last 8760 hours. HbA1C: No results for input(s): HGBA1C in the last 72 hours. CBG: No results for input(s): GLUCAP in the last 168 hours. Lipid Profile: Recent Labs    01/20/21 0132  TRIG 77   Thyroid Function Tests: Recent Labs    01/20/21 0604  TSH 1.049   Anemia Panel: Recent Labs    01/20/21 0303 01/20/21 0604  FERRITIN 216  --   TIBC  --  300  IRON  --  32   Sepsis Labs: Recent Labs  Lab 01/20/21 0132 01/20/21 0604  PROCALCITON <0.10  --   LATICACIDVEN  --  0.9    Recent Results (from  the past 240 hour(s))  Resp Panel by RT-PCR (Flu A&B, Covid) Nasopharyngeal Swab     Status: Abnormal   Collection Time: 01/19/21 11:32 PM   Specimen: Nasopharyngeal Swab; Nasopharyngeal(NP) swabs in vial transport medium  Result Value Ref Range Status   SARS Coronavirus 2 by RT PCR POSITIVE (A) NEGATIVE Final    Comment: RESULT CALLED TO, READ BACK BY AND VERIFIED WITH: L. ADKINS,CHARGE RN 01/22/21 01/20/2021 T. TYSOR (NOTE) SARS-CoV-2 target nucleic acids are DETECTED.  The SARS-CoV-2 RNA is generally  detectable in upper respiratory specimens during the acute phase of infection. Positive results are indicative of the presence of the identified virus, but do not rule out bacterial infection or co-infection with other pathogens not detected by the test. Clinical correlation with patient history and other diagnostic information is necessary to determine patient infection status. The expected result is Negative.  Fact Sheet for Patients: BloggerCourse.com  Fact Sheet for Healthcare Providers: SeriousBroker.it  This test is not yet approved or cleared by the [place] FDA and  has been authorized for detection and/or diagnosis of SARS-CoV-2 by FDA under an Emergency Use Authorization (EUA).  This EUA will remain in effect (meaning this  test can be used) for the duration of  the COVID-19 declaration under Section 564(b)(1) of the Act, 21 U.S.C. section 360bbb-3(b)(1), unless the authorization is terminated or revoked sooner.     Influenza A by PCR NEGATIVE NEGATIVE Final   Influenza B by PCR NEGATIVE NEGATIVE Final    Comment: (NOTE) The Xpert Xpress SARS-CoV-2/FLU/RSV plus assay is intended as an aid in the diagnosis of influenza from Nasopharyngeal swab specimens and should not be used as a sole basis for treatment. Nasal washings and aspirates are unacceptable for Xpert Xpress SARS-CoV-2/FLU/RSV testing.  Fact Sheet for  Patients: BloggerCourse.com  Fact Sheet for Healthcare Providers: SeriousBroker.it  This test is not yet approved or cleared by the [place] FDA and has been authorized for detection and/or diagnosis of SARS-CoV-2 by FDA under an Emergency Use Authorization (EUA). This EUA will remain in effect (meaning this test can be used) for the duration of the COVID-19 declaration under Section 564(b)(1) of the Act, 21 U.S.C. section 360bbb-3(b)(1), unless the authorization is terminated or revoked.  Performed at [facility], [address], [place], [place] [address]          Radiology Studies: No results found.      Scheduled Meds: . (feeding supplement) PROSource Plus  30 mL Oral Daily  . vitamin C  500 mg Oral Daily  . enoxaparin (LOVENOX) injection  40 mg Subcutaneous Q24H  . feeding supplement  237 mL Oral BID BM  . fluticasone  2 spray Each Nare Daily  . levothyroxine  100 mcg Oral Q0600  . multivitamin with minerals  1 tablet Oral Daily  . zinc sulfate  220 mg Oral Daily   Continuous Infusions: . sodium chloride    . remdesivir 100 mg in NS 100 mL 100 mg (01/21/21 1012)     LOS: 2 days    Time spent: 25 mins.More than 50% of that time was spent in counseling and/or coordination of care.      Burnadette Pop, MD [facility] P5/22/2022, 7:51 AM

## 2021-01-23 DIAGNOSIS — E871 Hypo-osmolality and hyponatremia: Secondary | ICD-10-CM | POA: Diagnosis not present

## 2021-01-23 LAB — CBC WITH DIFFERENTIAL/PLATELET
Abs Immature Granulocytes: NONE SEEN 10*3/uL (ref 0.00–0.07)
Band Neutrophils: 1 %
Basophils Absolute: 0 10*3/uL (ref 0.0–0.1)
Basophils Relative: 0 %
Blasts: NONE SEEN %
Eosinophils Absolute: 0.1 10*3/uL (ref 0.0–0.5)
Eosinophils Relative: 2 %
HCT: 35.2 % — ABNORMAL LOW (ref 36.0–46.0)
Hemoglobin: 12.1 g/dL (ref 12.0–15.0)
Immature Granulocytes: NONE SEEN %
Lymphocytes Relative: 65 %
Lymphs Abs: 2.3 10*3/uL (ref 0.7–4.0)
MCH: 30.1 pg (ref 26.0–34.0)
MCHC: 34.4 g/dL (ref 30.0–36.0)
MCV: 87.6 fL (ref 80.0–100.0)
Metamyelocytes Relative: NONE SEEN %
Monocytes Absolute: 0.4 10*3/uL (ref 0.1–1.0)
Monocytes Relative: 5 %
Myelocytes: NONE SEEN %
Neutro Abs: 0.7 10*3/uL — ABNORMAL LOW (ref 1.7–7.7)
Neutrophils Relative %: 27 %
Platelets: 181 10*3/uL (ref 150–400)
Promyelocytes Relative: NONE SEEN %
RBC Morphology: NORMAL
RBC: 4.02 MIL/uL (ref 3.87–5.11)
RDW: 12.8 % (ref 11.5–15.5)
WBC Morphology: REACTIVE
WBC: 3.1 10*3/uL — ABNORMAL LOW (ref 4.0–10.5)
nRBC: 0 % (ref 0.0–0.2)
nRBC: NONE SEEN /100 WBC

## 2021-01-23 LAB — COMPREHENSIVE METABOLIC PANEL
ALT: 35 U/L (ref 0–44)
AST: 29 U/L (ref 15–41)
Albumin: 3.3 g/dL — ABNORMAL LOW (ref 3.5–5.0)
Alkaline Phosphatase: 74 U/L (ref 38–126)
Anion gap: 4 — ABNORMAL LOW (ref 5–15)
BUN: 14 mg/dL (ref 6–20)
CO2: 24 mmol/L (ref 22–32)
Calcium: 8.9 mg/dL (ref 8.9–10.3)
Chloride: 102 mmol/L (ref 98–111)
Creatinine, Ser: 0.69 mg/dL (ref 0.44–1.00)
GFR, Estimated: >60 mL/min (ref >60)
Glucose, Bld: 94 mg/dL (ref 70–99)
Potassium: 4.4 mmol/L (ref 3.5–5.1)
Sodium: 130 mmol/L — ABNORMAL LOW (ref 135–145)
Total Bilirubin: 0.3 mg/dL (ref 0.3–1.2)
Total Protein: 5.8 g/dL — ABNORMAL LOW (ref 6.5–8.1)

## 2021-01-23 LAB — MAGNESIUM: Magnesium: 2.2 mg/dL (ref 1.7–2.4)

## 2021-01-23 LAB — C-REACTIVE PROTEIN: CRP: 0.8 mg/dL (ref <1.0)

## 2021-01-23 LAB — D-DIMER, QUANTITATIVE: D-Dimer, Quant: <0.27 ug/mL-FEU (ref 0.00–0.50)

## 2021-01-23 NOTE — Progress Notes (Signed)
PROGRESS NOTE    Michelle Casey  ZPH:[id number] DOB: September 03, 1961 DOA: 01/20/2021 PCP: Daisy Floro, MD   Chief Complain: Shortness of breath, nausea, weakness  Brief Narrative: Patient is a 60 year old female with history of hypothyroidism who presented at [facility] with complaint of shortness of breath, nausea, weakness.  She was tested for COVID on 5/15 and it was positive.  She started feeling more weak, started having cough, nausea so she presented to the emergency department.  On presentation, COVID screen test was positive.  Sodium level was found to be 114.  She was admitted for further management.  Started on IV remdesivir.  Not started on steroids because she was not hypoxic.  Hospital course remarkable for fever,now resolved.  Hospital course remarkable for hyponatremia, hypertension.  Her respirations are much better and she is on room air.  Plan for discharge tomorrow to home after improvement in the blood pressure and sodium level.  Assessment & Plan:   Principal Problem:   Hyponatremia Active Problems:   COVID-19 virus infection   Hypothyroidism   Abnormal LFTs   Raised serum iron   COVID illness: Not hypoxic.  Chest x-ray did not show pneumonia.  Started on remdesivir,day 3/3.  Currently on room air.  She was febrile intermittently during his hospitalization, afebrile today.  Blood cultures have been sent, no growth till date Continue to monitor inflammatory markers, incentive spirometer, flutter valve, bronchodilators as needed. Fever was most likely from COVID illness, now resolved  Hyponatremia: Most likely secondary to dehydration.  Responded well to the IV fluids.  Continue IV fluids.  Hypotension: Patient blood pressure has been soft consistently.  Status post bolus of 500 mL of normal saline and started on maintenance IV fluids since yesterday.  Looks like her baseline blood pressure is at low.  Hypothyroidism: on Synthyroid  Sore throat: Continue  Cepacol lozenges, spray.MUch better today    Nutrition Problem: Increased nutrient needs Etiology: acute illness,catabolic illness (COVID-19 infection)      DVT prophylaxis:Lovenox Code Status: Full Family Communication:: Discussed with husband on 01/21/21  Status is: Inpatient  Remains inpatient appropriate because:Inpatient level of care appropriate due to severity of illness   Dispo: The patient is from: Home              Anticipated d/c is to: Home              Patient currently is not medically stable to d/c.   Difficult to place patient No      Consultants: None  Procedures:None  Antimicrobials:  Anti-infectives (From admission, onward)   Start     Dose/Rate Route Frequency Ordered Stop   01/21/21 1000  remdesivir 100 mg in sodium chloride 0.9 % 100 mL IVPB        100 mg 200 mL/hr over 30 Minutes Intravenous Daily 01/20/21 0259 01/22/21 0952   01/20/21 0400  remdesivir 100 mg in sodium chloride 0.9 % 100 mL IVPB       "Followed by" Linked Group Details   100 mg 200 mL/hr over 30 Minutes Intravenous  Once 01/20/21 0258 01/20/21 0557   01/20/21 0330  remdesivir 100 mg in sodium chloride 0.9 % 100 mL IVPB       "Followed by" Linked Group Details   100 mg 200 mL/hr over 30 Minutes Intravenous NOW 01/20/21 0258 01/20/21 0400      Subjective:  Patient seen and examined at bedside this morning.  Hemodynamically stable but blood  pressure is soft.  Denies any lightheadedness, dizziness or sore throat today.  On room air.    Objective: Vitals:   01/22/21 1249 01/22/21 1717 01/22/21 2038 01/23/21 0545  BP: 103/76 90/60 (!) 99/53 (!) 99/56  Pulse: 74 79 67 65  Resp: 14  16 20   Temp:  98.5 F (36.9 C) 98.2 F (36.8 C) 98.4 F (36.9 C)  TempSrc:  Oral Oral Oral  SpO2: 97% 97% 99% 97%  Weight:      Height:        Intake/Output Summary (Last 24 hours) at 01/23/2021 1151 Last data filed at 01/23/2021 0600 Gross per 24 hour  Intake 1536.86 ml  Output --   Net 1536.86 ml   Filed Weights   01/20/21 0500  Weight: 60.7 kg    Examination:  General exam: Overall comfortable, not in distress HEENT: PERRL Respiratory system:  no wheezes or crackles  Cardiovascular system: S1 & S2 heard, RRR.  Gastrointestinal system: Abdomen is nondistended, soft and nontender. Central nervous system: Alert and oriented Extremities: No edema, no clubbing ,no cyanosis Skin: No rashes, no ulcers,no icterus   Data Reviewed: I have personally reviewed following labs and imaging studies  CBC: Recent Labs  Lab 01/20/21 0132 01/21/21 0347 01/22/21 0406 01/23/21 0339  WBC 5.6 3.9* 3.0* 3.1*  NEUTROABS 3.3 1.9 1.0* 0.7*  HGB 13.4 12.8 13.4 12.1  HCT 37.2 37.0 39.1 35.2*  MCV 83.0 87.1 87.1 87.6  PLT 210 194 212 181   Basic Metabolic Panel: Recent Labs  Lab 01/20/21 1230 01/20/21 2014 01/21/21 0347 01/22/21 0406 01/23/21 0339  NA 114* 119* 126* 129* 130*  K 3.7 3.5 3.7 3.7 4.4  CL 90* 92* 98 97* 102  CO2 15* 22 21* 22 24  GLUCOSE 80 88 84 94 94  BUN 8 9 9 13 14   CREATININE 0.46 0.70 0.70 0.79 0.69  CALCIUM 8.1* 8.2* 8.6* 9.1 8.9  MG  --   --  2.0 2.2 2.2   GFR: Estimated Creatinine Clearance: 74.3 mL/min (by C-G formula based on SCr of 0.69 mg/dL). Liver Function Tests: Recent Labs  Lab 01/20/21 0303 01/21/21 0347 01/22/21 0406 01/23/21 0339  AST 25 21 41 29  ALT 31 26 41 35  ALKPHOS 83 74 90 74  BILITOT 0.8 0.5 0.2* 0.3  PROT 5.6* 5.9* 6.5 5.8*  ALBUMIN 3.3* 3.3* 3.6 3.3*   No results for input(s): LIPASE, AMYLASE in the last 168 hours. No results for input(s): AMMONIA in the last 168 hours. Coagulation Profile: No results for input(s): INR, PROTIME in the last 168 hours. Cardiac Enzymes: No results for input(s): CKTOTAL, CKMB, CKMBINDEX, TROPONINI in the last 168 hours. BNP (last 3 results) No results for input(s): PROBNP in the last 8760 hours. HbA1C: No results for input(s): HGBA1C in the last 72 hours. CBG: No  results for input(s): GLUCAP in the last 168 hours. Lipid Profile: No results for input(s): CHOL, HDL, LDLCALC, TRIG, CHOLHDL, LDLDIRECT in the last 72 hours. Thyroid Function Tests: No results for input(s): TSH, T4TOTAL, FREET4, T3FREE, THYROIDAB in the last 72 hours. Anemia Panel: No results for input(s): VITAMINB12, FOLATE, FERRITIN, TIBC, IRON, RETICCTPCT in the last 72 hours. Sepsis Labs: Recent Labs  Lab 01/20/21 0132 01/20/21 0604  PROCALCITON <0.10  --   LATICACIDVEN  --  0.9    Recent Results (from the past 240 hour(s))  Resp Panel by RT-PCR (Flu A&B, Covid) Nasopharyngeal Swab     Status: Abnormal   Collection Time:  01/19/21 11:32 PM   Specimen: Nasopharyngeal Swab; Nasopharyngeal(NP) swabs in vial transport medium  Result Value Ref Range Status   SARS Coronavirus 2 by RT PCR POSITIVE (A) NEGATIVE Final    Comment: RESULT CALLED TO, READ BACK BY AND VERIFIED WITH: L. ADKINS,CHARGE RN 1950 01/20/2021 T. TYSOR (NOTE) SARS-CoV-2 target nucleic acids are DETECTED.  The SARS-CoV-2 RNA is generally detectable in upper respiratory specimens during the acute phase of infection. Positive results are indicative of the presence of the identified virus, but do not rule out bacterial infection or co-infection with other pathogens not detected by the test. Clinical correlation with patient history and other diagnostic information is necessary to determine patient infection status. The expected result is Negative.  Fact Sheet for Patients: BloggerCourse.com  Fact Sheet for Healthcare Providers: SeriousBroker.it  This test is not yet approved or cleared by the [place] FDA and  has been authorized for detection and/or diagnosis of SARS-CoV-2 by FDA under an Emergency Use Authorization (EUA).  This EUA will remain in effect (meaning this  test can be used) for the duration of  the COVID-19 declaration under Section 564(b)(1)  of the Act, 21 U.S.C. section 360bbb-3(b)(1), unless the authorization is terminated or revoked sooner.     Influenza A by PCR NEGATIVE NEGATIVE Final   Influenza B by PCR NEGATIVE NEGATIVE Final    Comment: (NOTE) The Xpert Xpress SARS-CoV-2/FLU/RSV plus assay is intended as an aid in the diagnosis of influenza from Nasopharyngeal swab specimens and should not be used as a sole basis for treatment. Nasal washings and aspirates are unacceptable for Xpert Xpress SARS-CoV-2/FLU/RSV testing.  Fact Sheet for Patients: BloggerCourse.com  Fact Sheet for Healthcare Providers: SeriousBroker.it  This test is not yet approved or cleared by the [place] FDA and has been authorized for detection and/or diagnosis of SARS-CoV-2 by FDA under an Emergency Use Authorization (EUA). This EUA will remain in effect (meaning this test can be used) for the duration of the COVID-19 declaration under Section 564(b)(1) of the Act, 21 U.S.C. section 360bbb-3(b)(1), unless the authorization is terminated or revoked.  Performed at [facility], [address] [place], [place] [address]   Blood Culture (routine x 2)     Status: None (Preliminary result)   Collection Time: 01/20/21  3:04 AM   Specimen: BLOOD RIGHT WRIST  Result Value Ref Range Status   Specimen Description   Final    BLOOD RIGHT WRIST Performed at [facility], [address] [place], [place] [address]    Special Requests   Final    BOTTLES DRAWN AEROBIC AND ANAEROBIC Blood Culture adequate volume Performed at [facility], [address] [place], [place] [address]    Culture   Final    NO GROWTH 3 DAYS Performed at [facility], [address], [place], [place] [address]    Report Status PENDING  Incomplete  Blood Culture (routine x 2)     Status: None (Preliminary result)   Collection Time: 01/20/21  3:09 AM   Specimen: BLOOD  Result  Value Ref Range Status   Specimen Description   Final    BLOOD RIGHT ANTECUBITAL Performed at [facility], [address] [place], [place] [address]    Special Requests   Final    BOTTLES DRAWN AEROBIC AND ANAEROBIC Blood Culture adequate volume Performed at [facility], [address], [place], [place] [address]  Culture   Final    NO GROWTH 3 DAYS Performed at [facility], [address] [place], [place] [address]    Report Status PENDING  Incomplete         Radiology Studies: No results found.      Scheduled Meds: . (feeding supplement) PROSource Plus  30 mL Oral Daily  . vitamin C  500 mg Oral Daily  . enoxaparin (LOVENOX) injection  40 mg Subcutaneous Q24H  . feeding supplement  237 mL Oral BID BM  . fluticasone  2 spray Each Nare Daily  . levothyroxine  100 mcg Oral Q0600  . multivitamin with minerals  1 tablet Oral Daily  . polyethylene glycol  17 g Oral BID  . zinc sulfate  220 mg Oral Daily   Continuous Infusions: . sodium chloride 100 mL/hr at 01/23/21 0859     LOS: 3 days    Time spent: 25 mins.More than 50% of that time was spent in counseling and/or coordination of care.      Burnadette PopAmrit Remmi Armenteros, MD [facility] P5/23/2022, 11:51 AM

## 2021-01-23 NOTE — Plan of Care (Signed)

## 2021-01-24 DIAGNOSIS — E871 Hypo-osmolality and hyponatremia: Secondary | ICD-10-CM | POA: Diagnosis not present

## 2021-01-24 LAB — CBC WITH DIFFERENTIAL/PLATELET
Abs Immature Granulocytes: 0.01 10*3/uL (ref 0.00–0.07)
Basophils Absolute: 0 10*3/uL (ref 0.0–0.1)
Basophils Relative: 1 %
Eosinophils Absolute: 0.1 10*3/uL (ref 0.0–0.5)
Eosinophils Relative: 4 %
HCT: 33.2 % — ABNORMAL LOW (ref 36.0–46.0)
Hemoglobin: 11.3 g/dL — ABNORMAL LOW (ref 12.0–15.0)
Immature Granulocytes: 0 %
Lymphocytes Relative: 54 %
Lymphs Abs: 1.9 10*3/uL (ref 0.7–4.0)
MCH: 30 pg (ref 26.0–34.0)
MCHC: 34 g/dL (ref 30.0–36.0)
MCV: 88.1 fL (ref 80.0–100.0)
Monocytes Absolute: 0.3 10*3/uL (ref 0.1–1.0)
Monocytes Relative: 10 %
Neutro Abs: 1.1 10*3/uL — ABNORMAL LOW (ref 1.7–7.7)
Neutrophils Relative %: 31 %
Platelets: 188 10*3/uL (ref 150–400)
RBC: 3.77 MIL/uL — ABNORMAL LOW (ref 3.87–5.11)
RDW: 12.9 % (ref 11.5–15.5)
WBC: 3.5 10*3/uL — ABNORMAL LOW (ref 4.0–10.5)
nRBC: 0 % (ref 0.0–0.2)

## 2021-01-24 LAB — BASIC METABOLIC PANEL
Anion gap: 1 — ABNORMAL LOW (ref 5–15)
BUN: 9 mg/dL (ref 6–20)
CO2: 21 mmol/L — ABNORMAL LOW (ref 22–32)
Calcium: 7.4 mg/dL — ABNORMAL LOW (ref 8.9–10.3)
Chloride: 113 mmol/L — ABNORMAL HIGH (ref 98–111)
Creatinine, Ser: 0.53 mg/dL (ref 0.44–1.00)
GFR, Estimated: >60 mL/min (ref >60)
Glucose, Bld: 82 mg/dL (ref 70–99)
Potassium: 3.3 mmol/L — ABNORMAL LOW (ref 3.5–5.1)
Sodium: 135 mmol/L (ref 135–145)

## 2021-01-24 MED ORDER — POTASSIUM CHLORIDE CRYS ER 20 MEQ PO TBCR
40.0000 meq | EXTENDED_RELEASE_TABLET | Freq: Once | ORAL | Status: AC
  Administered 2021-01-24: 40 meq via ORAL
  Filled 2021-01-24: qty 2

## 2021-01-24 NOTE — Plan of Care (Signed)
Discharge instructions reviewed with patient, questions answered, verbalized understanding.  Patient transported to main entrance via wheelchair to be picked up by family members.

## 2021-01-24 NOTE — Discharge Summary (Signed)
Physician Discharge Summary  Michelle Casey WUJ:[id number] DOB: 06/18/1961 DOA: 01/20/2021  PCP: Daisy Floro, MD  Admit date: 01/20/2021 Discharge date: 01/24/2021  Admitted From: Home Disposition:  Home  Discharge Condition:Stable CODE STATUS:FULL Diet recommendation:  Regular  Brief/Interim Summary: Patient is a 60 year old female with history of hypothyroidism [facility], nausea, weakness. She was tested for COVID on 5/15 and it was positive. She started feeling more weak, started having cough, nausea so she presented to the emergency department. On presentation, COVID screen test was positive. Sodium level was found to be 114. She was admitted for further management. Started on IV remdesivir. Not started on steroids because she was not hypoxic.  Hospital course remarkable for fever,now resolved.  Hospital course remarkable for hyponatremia, hypotension.    Currently she is feeling much better.  She finished the course of remdesivir.  Hyponatremia improved.  She is medically stable for discharge home today.  Following problems were addressed during her hospitalization:  COVID illness: Not hypoxic.  Chest x-ray did not show pneumonia.  Started on remdesivir,completed course.  Currently on room air.  She was febrile intermittently during his hospitalization, afebrile for more than 72 hrs.  Blood cultures have been sent, no growth till date.  Inflammatory markers improved.  Hyponatremia: Resolved  Hypotension: Patient blood pressure was soft consistently.  Treated with IV fluids. Looks like her baseline blood pressure is at low.  Hypothyroidism: on Synthyroid  Sore throat: Resolved     Discharge Diagnoses:  Principal Problem:   Hyponatremia Active Problems:   COVID-19 virus infection   Hypothyroidism   Abnormal LFTs   Raised serum iron    Discharge Instructions  Discharge Instructions    Diet  general   Complete by: As directed    Discharge instructions   Complete by: As directed    1)Please follow up with your PCP in a week 2)Infection Prevention Recommendations for Individuals Confirmed to have, or Being Evaluated for, 2019 Novel Coronavirus (COVID-19) Infection Who Receive Care at Home  Individuals who are confirmed to have, or are being evaluated for, COVID-19 should follow the prevention steps below until a healthcare provider or local or state health department says they can return to normal activities.  Stay home except to get medical care You should restrict activities outside your home, except for getting medical care. Do not go to work, school, or public areas, and do not use public transportation or taxis.  Call ahead before visiting your doctor Before your medical appointment, call the healthcare provider and tell them that you have, or are being evaluated for, COVID-19 infection. This will help the healthcare provider's office take steps to keep other people from getting infected. Ask your healthcare provider to call the local or state health department.  Monitor your symptoms Seek prompt medical attention if your illness is worsening (e.g., difficulty breathing). Before going to your medical appointment, call the healthcare provider and tell them that you have, or are being evaluated for, COVID-19 infection. Ask your healthcare provider to call the local or state health department.  Wear a facemask You should wear a facemask that covers your nose and mouth when you are in the same room with other people and when you visit a healthcare provider. People who live with or visit you should also wear a facemask while they are in the same room with you.  Separate yourself from other people in your home As much as  possible, you should stay in a different room from other people in your home. Also, you should use a separate bathroom, if available.  Avoid  sharing household items You should not share dishes, drinking glasses, cups, eating utensils, towels, bedding, or other items with other people in your home. After using these items, you should wash them thoroughly with soap and water.  Cover your coughs and sneezes Cover your mouth and nose with a tissue when you cough or sneeze, or you can cough or sneeze into your sleeve. Throw used tissues in a lined trash can, and immediately wash your hands with soap and water for at least 20 seconds or use an alcohol-based hand rub.  Wash your [employer] your hands often and thoroughly with soap and water for at least 20 seconds. You can use an alcohol-based hand sanitizer if soap and water are not available and if your hands are not visibly dirty. Avoid touching your eyes, nose, and mouth with unwashed hands.   Prevention Steps for Caregivers and Household Members of Individuals Confirmed to have, or Being Evaluated for, COVID-19 Infection Being Cared for in the Home  If you live with, or provide care at home for, a person confirmed to have, or being evaluated for, COVID-19 infection please follow these guidelines to prevent infection:  Follow healthcare provider's instructions Make sure that you understand and can help the patient follow any healthcare provider instructions for all care.  Provide for the patient's basic needs You should help the patient with basic needs in the home and provide support for getting groceries, prescriptions, and other personal needs.  Monitor the patient's symptoms If they are getting sicker, call his or her medical provider and tell them that the patient has, or is being evaluated for, COVID-19 infection. This will help the healthcare provider's office take steps to keep other people from getting infected. Ask the healthcare provider to call the local or state health department.  Limit the number of people who have contact with the patient If  possible, have only one caregiver for the patient. Other household members should stay in another home or place of residence. If this is not possible, they should stay in another room, or be separated from the patient as much as possible. Use a separate bathroom, if available. Restrict visitors who do not have an essential need to be in the home.  Keep older adults, very young children, and other sick people away from the patient Keep older adults, very young children, and those who have compromised immune systems or chronic health conditions away from the patient. This includes people with chronic heart, lung, or kidney conditions, diabetes, and cancer.  Ensure good ventilation Make sure that shared spaces in the home have good air flow, such as from an air conditioner or an opened window, weather permitting.  Wash your hands often Wash your hands often and thoroughly with soap and water for at least 20 seconds. You can use an alcohol based hand sanitizer if soap and water are not available and if your hands are not visibly dirty. Avoid touching your eyes, nose, and mouth with unwashed hands. Use disposable paper towels to dry your hands. If not available, use dedicated cloth towels and replace them when they become wet.  Wear a facemask and gloves Wear a disposable facemask at all times in the room and gloves when you touch or have contact with the patient's blood, body fluids, and/or secretions or excretions, such as sweat, saliva,  sputum, nasal mucus, vomit, urine, or feces. Ensure the mask fits over your nose and mouth tightly, and do not touch it during use. Throw out disposable facemasks and gloves after using them. Do not reuse. Wash your hands immediately after removing your facemask and gloves. If your personal clothing becomes contaminated, carefully remove clothing and launder. Wash your hands after handling contaminated clothing. Place all used disposable facemasks, gloves,  and other waste in a lined container before disposing them with other household waste. Remove gloves and wash your hands immediately after handling these items.  Do not share dishes, glasses, or other household items with the patient Avoid sharing household items. You should not share dishes, drinking glasses, cups, eating utensils, towels, bedding, or other items with a patient who is confirmed to have, or being evaluated for, COVID-19 infection. After the person uses these items, you should wash them thoroughly with soap and water.  Wash laundry thoroughly Immediately remove and wash clothes or bedding that have blood, body fluids, and/or secretions or excretions, such as sweat, saliva, sputum, nasal mucus, vomit, urine, or feces, on them. Wear gloves when handling laundry from the patient. Read and follow directions on labels of laundry or clothing items and detergent. In general, wash and dry with the warmest temperatures recommended on the label.  Clean all areas the individual has used often Clean all touchable surfaces, such as counters, tabletops, doorknobs, bathroom fixtures, toilets, phones, keyboards, tablets, and bedside tables, every day. Also, clean any surfaces that [place] have blood, body fluids, and/or secretions or excretions on them. Wear gloves when cleaning surfaces the patient has come in contact with. Use a diluted bleach solution (e.g., dilute bleach with 1 part bleach and 10 parts water) or a household disinfectant with a label that says EPA-registered for coronaviruses. To make a bleach solution at home, add 1 tablespoon of bleach to 1 quart (4 cups) of water. For a larger supply, add  cup of bleach to 1 gallon (16 cups) of water. Read labels of cleaning products and follow recommendations provided on product labels. Labels contain instructions for safe and effective use of the cleaning product including precautions you should take when applying the product, such as wearing  gloves or eye protection and making sure you have good ventilation during use of the product. Remove gloves and wash hands immediately after cleaning.  Monitor yourself for signs and symptoms of illness Caregivers and household members are considered close contacts, should monitor their health, and will be asked to limit movement outside of the home to the extent possible. Follow the monitoring steps for close contacts listed on the symptom monitoring form.   Increase activity slowly   Complete by: As directed      Allergies as of 01/24/2021      Reactions   Carbimazole Itching, Rash      Medication List    TAKE these medications   levothyroxine 100 MCG tablet Commonly known as: SYNTHROID Take 100 mcg by mouth daily.   Multi-Day Vitamins Tabs Take 1 tablet by mouth daily.   ondansetron 4 MG disintegrating tablet Commonly known as: ZOFRAN-ODT Take 4 mg by mouth every 8 (eight) hours as needed for nausea/vomiting.   Vitamin D3 125 MCG (5000 UT) Caps Take 5,000 Units by mouth daily.   zinc gluconate 50 MG tablet Take 50 mg by mouth daily.       Follow-up Information    Daisy Floro, MD. Schedule an appointment as soon as possible for  a visit in 1 week(s).   Specialty: Family Medicine Contact information: [address] [place] [address] [phone]              Allergies  Allergen Reactions  . Carbimazole Itching and Rash    Consultations: None  Procedures/Studies: DG Chest Port 1 View  Result Date: 01/20/2021 CLINICAL DATA:  cough EXAM: PORTABLE CHEST 1 VIEW COMPARISON:  None. FINDINGS: The heart size and mediastinal contours are within normal limits. No focal consolidation. No visible pleural effusion or pneumothorax. The visualized skeletal structures are unremarkable. IMPRESSION: No acute cardiopulmonary disease. Electronically Signed   By: Maudry Mayhew MD   On: 01/20/2021 02:56       Subjective:  Patient seen and examined the  bedside this morning. Hemodynamically stable for discharge.   Discharge Exam: Vitals:   01/23/21 1949 01/24/21 0519  BP: (!) 99/54 103/64  Pulse: 67 88  Resp: 18 18  Temp: (!) 97.2 F (36.2 C) 98.4 F (36.9 C)  SpO2: 99% 99%   Vitals:   01/23/21 0545 01/23/21 1212 01/23/21 1949 01/24/21 0519  BP: (!) 99/56 111/75 (!) 99/54 103/64  Pulse: 65 67 67 88  Resp: 20 20 18 18   Temp: 98.4 F (36.9 C) (!) 97.4 F (36.3 C) (!) 97.2 F (36.2 C) 98.4 F (36.9 C)  TempSrc: Oral Oral Oral Oral  SpO2: 97% 100% 99% 99%  Weight:      Height:        General: Pt is alert, awake, not in acute distress Cardiovascular: RRR, S1/S2 +, no rubs, no gallops Respiratory: CTA bilaterally, no wheezing, no rhonchi Abdominal: Soft, NT, ND, bowel sounds + Extremities: no edema, no cyanosis    The results of significant diagnostics from this hospitalization (including imaging, microbiology, ancillary and laboratory) are listed below for reference.     Microbiology: Recent Results (from the past 240 hour(s))  Resp Panel by RT-PCR (Flu A&B, Covid) Nasopharyngeal Swab     Status: Abnormal   Collection Time: 01/19/21 11:32 PM   Specimen: Nasopharyngeal Swab; Nasopharyngeal(NP) swabs in vial transport medium  Result Value Ref Range Status   SARS Coronavirus 2 by RT PCR POSITIVE (A) NEGATIVE Final    Comment: RESULT CALLED TO, READ BACK BY AND VERIFIED WITH: L. ADKINS,CHARGE RN 01/21/21 01/20/2021 T. TYSOR (NOTE) SARS-CoV-2 target nucleic acids are DETECTED.  The SARS-CoV-2 RNA is generally detectable in upper respiratory specimens during the acute phase of infection. Positive results are indicative of the presence of the identified virus, but do not rule out bacterial infection or co-infection with other pathogens not detected by the test. Clinical correlation with patient history and other diagnostic information is necessary to determine patient infection status. The expected result is  Negative.  Fact Sheet for Patients: 01/22/2021  Fact Sheet for Healthcare Providers: BloggerCourse.com  This test is not yet approved or cleared by the [place] FDA and  has been authorized for detection and/or diagnosis of SARS-CoV-2 by FDA under an Emergency Use Authorization (EUA).  This EUA will remain in effect (meaning this  test can be used) for the duration of  the COVID-19 declaration under Section 564(b)(1) of the Act, 21 U.S.C. section 360bbb-3(b)(1), unless the authorization is terminated or revoked sooner.     Influenza A by PCR NEGATIVE NEGATIVE Final   Influenza B by PCR NEGATIVE NEGATIVE Final    Comment: (NOTE) The Xpert Xpress SARS-CoV-2/FLU/RSV plus assay is intended as an aid in the diagnosis of influenza from  Nasopharyngeal swab specimens and should not be used as a sole basis for treatment. Nasal washings and aspirates are unacceptable for Xpert Xpress SARS-CoV-2/FLU/RSV testing.  Fact Sheet for Patients: BloggerCourse.com  Fact Sheet for Healthcare Providers: SeriousBroker.it  This test is not yet approved or cleared by the [place] FDA and has been authorized for detection and/or diagnosis of SARS-CoV-2 by FDA under an Emergency Use Authorization (EUA). This EUA will remain in effect (meaning this test can be used) for the duration of the COVID-19 declaration under Section 564(b)(1) of the Act, 21 U.S.C. section 360bbb-3(b)(1), unless the authorization is terminated or revoked.  Performed at [facility], [address] [place], [place] [address]   Blood Culture (routine x 2)     Status: None (Preliminary result)   Collection Time: 01/20/21  3:04 AM   Specimen: BLOOD RIGHT WRIST  Result Value Ref Range Status   Specimen Description   Final    BLOOD RIGHT WRIST Performed at [facility], [address]  [place], [place] [address]    Special Requests   Final    BOTTLES DRAWN AEROBIC AND ANAEROBIC Blood Culture adequate volume Performed at [facility], [address] [place], [place] [address]    Culture   Final    NO GROWTH 4 DAYS Performed at [facility], [address] [place], [place] [address]    Report Status PENDING  Incomplete  Blood Culture (routine x 2)     Status: None (Preliminary result)   Collection Time: 01/20/21  3:09 AM   Specimen: BLOOD  Result Value Ref Range Status   Specimen Description   Final    BLOOD RIGHT ANTECUBITAL Performed at [facility], [address] [place], [place] [address]    Special Requests   Final    BOTTLES DRAWN AEROBIC AND ANAEROBIC Blood Culture adequate volume Performed at [facility], [address] [place], [place] [address]    Culture   Final    NO GROWTH 4 DAYS Performed at [facility], [address], [place], [place] [address]    Report Status PENDING  Incomplete     Labs: BNP (last 3 results) No results for input(s): BNP in the last 8760 hours. Basic Metabolic Panel: Recent Labs  Lab 01/20/21 2014 01/21/21 0347 01/22/21 0406 01/23/21 0339 01/24/21 0332  NA 119* 126* 129* 130* 135  K 3.5 3.7 3.7 4.4 3.3*  CL 92* 98 97* 102 113*  CO2 22 21* 22 24 21*  GLUCOSE 88 84 94 94 82  BUN CREATININE 0.70 0.70 0.79 0.69 0.53  CALCIUM 8.2* 8.6* 9.1 8.9 7.4*  MG  --  2.0 2.2 2.2  --    Liver Function Tests: Recent Labs  Lab 01/20/21 0303 01/21/21 0347 01/22/21 0406 01/23/21 0339  AST 25 21 41 29  ALT 31 26 41 35  ALKPHOS 83 74 90 74  BILITOT 0.8 0.5 0.2* 0.3  PROT 5.6* 5.9* 6.5 5.8*  ALBUMIN 3.3* 3.3* 3.6 3.3*   No results for input(s): LIPASE, AMYLASE in the last 168 hours. No results for input(s): AMMONIA in the last 168 hours. CBC: Recent Labs  Lab 01/20/21 0132 01/21/21 0347 01/22/21 0406 01/23/21 0339 01/24/21 0332  WBC 5.6 3.9* 3.0* 3.1* 3.5*   NEUTROABS 3.3 1.9 1.0* 0.7* 1.1*  HGB 13.4 12.8 13.4 12.1 11.3*  HCT 37.2  37.0 39.1 35.2* 33.2*  MCV 83.0 87.1 87.1 87.6 88.1  PLT 210 194 212 181 188   Cardiac Enzymes: No results for input(s): CKTOTAL, CKMB, CKMBINDEX, TROPONINI in the last 168 hours. BNP: Invalid input(s): POCBNP CBG: No results for input(s): GLUCAP in the last 168 hours. D-Dimer Recent Labs    01/22/21 0406 01/23/21 0339  DDIMER 0.47 <0.27   Hgb A1c No results for input(s): HGBA1C in the last 72 hours. Lipid Profile No results for input(s): CHOL, HDL, LDLCALC, TRIG, CHOLHDL, LDLDIRECT in the last 72 hours. Thyroid function studies No results for input(s): TSH, T4TOTAL, T3FREE, THYROIDAB in the last 72 hours.  Invalid input(s): FREET3 Anemia work up No results for input(s): VITAMINB12, FOLATE, FERRITIN, TIBC, IRON, RETICCTPCT in the last 72 hours. Urinalysis No results found for: COLORURINE, APPEARANCEUR, LABSPEC, PHURINE, GLUCOSEU, HGBUR, BILIRUBINUR, KETONESUR, PROTEINUR, UROBILINOGEN, NITRITE, LEUKOCYTESUR Sepsis Labs Invalid input(s): PROCALCITONIN,  WBC,  LACTICIDVEN Microbiology Recent Results (from the past 240 hour(s))  Resp Panel by RT-PCR (Flu A&B, Covid) Nasopharyngeal Swab     Status: Abnormal   Collection Time: 01/19/21 11:32 PM   Specimen: Nasopharyngeal Swab; Nasopharyngeal(NP) swabs in vial transport medium  Result Value Ref Range Status   SARS Coronavirus 2 by RT PCR POSITIVE (A) NEGATIVE Final    Comment: RESULT CALLED TO, READ BACK BY AND VERIFIED WITH: L. ADKINS,CHARGE RN [id number] 01/20/2021 T. TYSOR (NOTE) SARS-CoV-2 target nucleic acids are DETECTED.  The SARS-CoV-2 RNA is generally detectable in upper respiratory specimens during the acute phase of infection. Positive results are indicative of the presence of the identified virus, but do not rule out bacterial infection or co-infection with other pathogens not detected by the test. Clinical correlation with patient history  and other diagnostic information is necessary to determine patient infection status. The expected result is Negative.  Fact Sheet for Patients: BloggerCourse.comhttps://www.fda.gov/media/152166/download  Fact Sheet for Healthcare Providers: SeriousBroker.ithttps://www.fda.gov/media/152162/download  This test is not yet approved or cleared by the [place] FDA and  has been authorized for detection and/or diagnosis of SARS-CoV-2 by FDA under an Emergency Use Authorization (EUA).  This EUA will remain in effect (meaning this  test can be used) for the duration of  the COVID-19 declaration under Section 564(b)(1) of the Act, 21 U.S.C. section 360bbb-3(b)(1), unless the authorization is terminated or revoked sooner.     Influenza A by PCR NEGATIVE NEGATIVE Final   Influenza B by PCR NEGATIVE NEGATIVE Final    Comment: (NOTE) The Xpert Xpress SARS-CoV-2/FLU/RSV plus assay is intended as an aid in the diagnosis of influenza from Nasopharyngeal swab specimens and should not be used as a sole basis for treatment. Nasal washings and aspirates are unacceptable for Xpert Xpress SARS-CoV-2/FLU/RSV testing.  Fact Sheet for Patients: BloggerCourse.comhttps://www.fda.gov/media/152166/download  Fact Sheet for Healthcare Providers: SeriousBroker.ithttps://www.fda.gov/media/152162/download  This test is not yet approved or cleared by the [place] FDA and has been authorized for detection and/or diagnosis of SARS-CoV-2 by FDA under an Emergency Use Authorization (EUA). This EUA will remain in effect (meaning this test can be used) for the duration of the COVID-19 declaration under Section 564(b)(1) of the Act, 21 U.S.C. section 360bbb-3(b)(1), unless the authorization is terminated or revoked.  Performed at [facility] [place], [address] [place], [place] [id number]   Blood Culture (routine x 2)     Status: None (Preliminary result)   Collection Time: 01/20/21  3:04 AM   Specimen: BLOOD RIGHT WRIST  Result Value Ref Range Status  Specimen Description   Final    BLOOD RIGHT WRIST Performed at [facility], [address] [place], [place] [address]    Special Requests   Final    BOTTLES DRAWN AEROBIC AND ANAEROBIC Blood Culture adequate volume Performed at [facility], [address] [place], [place] [address]    Culture   Final    NO GROWTH 4 DAYS Performed at [facility], [address] [place], [place] [address]    Report Status PENDING  Incomplete  Blood Culture (routine x 2)     Status: None (Preliminary result)   Collection Time: 01/20/21  3:09 AM   Specimen: BLOOD  Result Value Ref Range Status   Specimen Description   Final    BLOOD RIGHT ANTECUBITAL Performed at [facility], [address] [place], [place] [address]    Special Requests   Final    BOTTLES DRAWN AEROBIC AND ANAEROBIC Blood Culture adequate volume Performed at [facility], [address] [place], [place] [address]    Culture   Final    NO GROWTH 4 DAYS Performed at [facility], [address] [place], [place] [address]    Report Status PENDING  Incomplete    Please note: You were cared for by a hospitalist during your hospital stay. Once you are discharged, your primary care physician will handle any further medical issues. Please note that NO REFILLS for any discharge medications will be authorized once you are discharged, as it is imperative that you return to your primary care physician (or establish a relationship with a primary care physician if you do not have one) for your post hospital discharge needs so that they can reassess your need for medications and monitor your lab values.    Time coordinating discharge: 40 minutes  SIGNED:   Burnadette Pop, MD  [facility] 01/24/2021, 11:11 AM Pager [phone]  If 7PM-7AM, please contact night-coverage [email or web address] Password [email or web address]

## 2021-01-25 LAB — CULTURE, BLOOD (ROUTINE X 2)
Culture: NO GROWTH
Culture: NO GROWTH
Special Requests: ADEQUATE
Special Requests: ADEQUATE

## 2021-02-02 ENCOUNTER — Other Ambulatory Visit: Payer: Self-pay | Admitting: Family Medicine

## 2021-02-02 DIAGNOSIS — R1031 Right lower quadrant pain: Secondary | ICD-10-CM

## 2021-02-08 ENCOUNTER — Inpatient Hospital Stay (HOSPITAL_COMMUNITY)
Admission: EM | Admit: 2021-02-08 | Discharge: 2021-02-11 | DRG: 643 | Disposition: A | Discharge status: Home / Self Care | Payer: Managed Care, Other (non HMO) | Attending: Internal Medicine | Admitting: Internal Medicine

## 2021-02-08 ENCOUNTER — Other Ambulatory Visit: Payer: Self-pay

## 2021-02-08 DIAGNOSIS — I959 Hypotension, unspecified: Secondary | ICD-10-CM

## 2021-02-08 DIAGNOSIS — E162 Hypoglycemia, unspecified: Secondary | ICD-10-CM | POA: Diagnosis present

## 2021-02-08 DIAGNOSIS — Z888 Allergy status to other drugs, medicaments and biological substances status: Secondary | ICD-10-CM

## 2021-02-08 DIAGNOSIS — E271 Primary adrenocortical insufficiency: Secondary | ICD-10-CM | POA: Diagnosis present

## 2021-02-08 DIAGNOSIS — Z79899 Other long term (current) drug therapy: Secondary | ICD-10-CM

## 2021-02-08 DIAGNOSIS — E272 Addisonian crisis: Secondary | ICD-10-CM | POA: Diagnosis present

## 2021-02-08 DIAGNOSIS — E063 Autoimmune thyroiditis: Secondary | ICD-10-CM | POA: Diagnosis present

## 2021-02-08 DIAGNOSIS — E871 Hypo-osmolality and hyponatremia: Secondary | ICD-10-CM | POA: Diagnosis present

## 2021-02-08 DIAGNOSIS — Z8673 Personal history of transient ischemic attack (TIA), and cerebral infarction without residual deficits: Secondary | ICD-10-CM

## 2021-02-08 DIAGNOSIS — Z79890 Hormone replacement therapy (postmenopausal): Secondary | ICD-10-CM

## 2021-02-08 DIAGNOSIS — U071 COVID-19: Secondary | ICD-10-CM | POA: Diagnosis present

## 2021-02-08 LAB — CBG MONITORING, ED: Glucose-Capillary: 104 mg/dL — ABNORMAL HIGH (ref 70–99)

## 2021-02-08 NOTE — ED Triage Notes (Signed)
Pt presents from home, reports n/v, weakness and dizziness starting today after taking first dose of hydrocortisone. Hx addison's and was told to come in due to risk of being in a crisis

## 2021-02-09 ENCOUNTER — Encounter (HOSPITAL_COMMUNITY): Payer: Self-pay | Admitting: Family Medicine

## 2021-02-09 DIAGNOSIS — Z8673 Personal history of transient ischemic attack (TIA), and cerebral infarction without residual deficits: Secondary | ICD-10-CM | POA: Diagnosis not present

## 2021-02-09 DIAGNOSIS — I959 Hypotension, unspecified: Secondary | ICD-10-CM

## 2021-02-09 DIAGNOSIS — Z79890 Hormone replacement therapy (postmenopausal): Secondary | ICD-10-CM | POA: Diagnosis not present

## 2021-02-09 DIAGNOSIS — Z79899 Other long term (current) drug therapy: Secondary | ICD-10-CM | POA: Diagnosis not present

## 2021-02-09 DIAGNOSIS — E063 Autoimmune thyroiditis: Secondary | ICD-10-CM | POA: Diagnosis present

## 2021-02-09 DIAGNOSIS — E271 Primary adrenocortical insufficiency: Secondary | ICD-10-CM | POA: Diagnosis present

## 2021-02-09 DIAGNOSIS — Z888 Allergy status to other drugs, medicaments and biological substances status: Secondary | ICD-10-CM | POA: Diagnosis not present

## 2021-02-09 DIAGNOSIS — E871 Hypo-osmolality and hyponatremia: Secondary | ICD-10-CM | POA: Diagnosis present

## 2021-02-09 DIAGNOSIS — E272 Addisonian crisis: Secondary | ICD-10-CM | POA: Diagnosis present

## 2021-02-09 DIAGNOSIS — U071 COVID-19: Secondary | ICD-10-CM | POA: Diagnosis present

## 2021-02-09 DIAGNOSIS — E162 Hypoglycemia, unspecified: Secondary | ICD-10-CM | POA: Diagnosis present

## 2021-02-09 LAB — URINALYSIS, ROUTINE W REFLEX MICROSCOPIC
Bilirubin Urine: NEGATIVE
Glucose, UA: NEGATIVE mg/dL
Hgb urine dipstick: NEGATIVE
Ketones, ur: 20 mg/dL — AB
Leukocytes,Ua: NEGATIVE
Nitrite: NEGATIVE
Protein, ur: NEGATIVE mg/dL
Specific Gravity, Urine: 1.006 (ref 1.005–1.030)
pH: 7 (ref 5.0–8.0)

## 2021-02-09 LAB — BASIC METABOLIC PANEL
Anion gap: 11 (ref 5–15)
Anion gap: 8 (ref 5–15)
Anion gap: 5 (ref 5–15)
Anion gap: 5 (ref 5–15)
BUN: 10 mg/dL (ref 6–20)
BUN: 8 mg/dL (ref 6–20)
BUN: 8 mg/dL (ref 6–20)
BUN: 14 mg/dL (ref 6–20)
CO2: 19 mmol/L — ABNORMAL LOW (ref 22–32)
CO2: 17 mmol/L — ABNORMAL LOW (ref 22–32)
CO2: 20 mmol/L — ABNORMAL LOW (ref 22–32)
CO2: 22 mmol/L (ref 22–32)
Calcium: 9.6 mg/dL (ref 8.9–10.3)
Calcium: 8.8 mg/dL — ABNORMAL LOW (ref 8.9–10.3)
Calcium: 9 mg/dL (ref 8.9–10.3)
Calcium: 8.6 mg/dL — ABNORMAL LOW (ref 8.9–10.3)
Chloride: 91 mmol/L — ABNORMAL LOW (ref 98–111)
Chloride: 98 mmol/L (ref 98–111)
Chloride: 113 mmol/L — ABNORMAL HIGH (ref 98–111)
Chloride: 110 mmol/L (ref 98–111)
Creatinine, Ser: 0.72 mg/dL (ref 0.44–1.00)
Creatinine, Ser: 0.56 mg/dL (ref 0.44–1.00)
Creatinine, Ser: 0.63 mg/dL (ref 0.44–1.00)
Creatinine, Ser: 0.98 mg/dL (ref 0.44–1.00)
GFR, Estimated: >60 mL/min (ref >60)
GFR, Estimated: >60 mL/min (ref >60)
GFR, Estimated: >60 mL/min (ref >60)
GFR, Estimated: >60 mL/min (ref >60)
Glucose, Bld: 98 mg/dL (ref 70–99)
Glucose, Bld: 107 mg/dL — ABNORMAL HIGH (ref 70–99)
Glucose, Bld: 121 mg/dL — ABNORMAL HIGH (ref 70–99)
Glucose, Bld: 119 mg/dL — ABNORMAL HIGH (ref 70–99)
Potassium: 3.7 mmol/L (ref 3.5–5.1)
Potassium: 3.7 mmol/L (ref 3.5–5.1)
Potassium: 3.9 mmol/L (ref 3.5–5.1)
Potassium: 4.7 mmol/L (ref 3.5–5.1)
Sodium: 121 mmol/L — ABNORMAL LOW (ref 135–145)
Sodium: 123 mmol/L — ABNORMAL LOW (ref 135–145)
Sodium: 138 mmol/L (ref 135–145)
Sodium: 137 mmol/L (ref 135–145)

## 2021-02-09 LAB — NA AND K (SODIUM & POTASSIUM), RAND UR
Potassium Urine: 15 mmol/L
Sodium, Ur: 72 mmol/L

## 2021-02-09 LAB — RESP PANEL BY RT-PCR (FLU A&B, COVID) ARPGX2
Influenza A by PCR: NEGATIVE
Influenza B by PCR: NEGATIVE
SARS Coronavirus 2 by RT PCR: POSITIVE — AB

## 2021-02-09 LAB — CBC
HCT: 36.3 % (ref 36.0–46.0)
Hemoglobin: 12.7 g/dL (ref 12.0–15.0)
MCH: 29.7 pg (ref 26.0–34.0)
MCHC: 35 g/dL (ref 30.0–36.0)
MCV: 85 fL (ref 80.0–100.0)
Platelets: 267 10*3/uL (ref 150–400)
RBC: 4.27 MIL/uL (ref 3.87–5.11)
RDW: 12.2 % (ref 11.5–15.5)
WBC: 5.9 10*3/uL (ref 4.0–10.5)
nRBC: 0 % (ref 0.0–0.2)

## 2021-02-09 LAB — OSMOLALITY, URINE: Osmolality, Ur: 271 mOsm/kg — ABNORMAL LOW (ref 300–900)

## 2021-02-09 LAB — BLOOD GAS, VENOUS
Acid-base deficit: 11.8 mmol/L — ABNORMAL HIGH (ref 0.0–2.0)
Bicarbonate: 10.4 mmol/L — ABNORMAL LOW (ref 20.0–28.0)
O2 Saturation: 99.6 %
Patient temperature: 98.6
pCO2, Ven: 13.4 mmHg — CL (ref 44.0–60.0)
pH, Ven: 7.502 — ABNORMAL HIGH (ref 7.250–7.430)
pO2, Ven: 160 mmHg — ABNORMAL HIGH (ref 32.0–45.0)

## 2021-02-09 LAB — TSH: TSH: 0.481 u[IU]/mL (ref 0.350–4.500)

## 2021-02-09 LAB — MAGNESIUM: Magnesium: 2.1 mg/dL (ref 1.7–2.4)

## 2021-02-09 LAB — LACTIC ACID, PLASMA: Lactic Acid, Venous: 2.3 mmol/L (ref 0.5–1.9)

## 2021-02-09 MED ORDER — ZINC SULFATE 220 (50 ZN) MG PO CAPS
220.0000 mg | ORAL_CAPSULE | Freq: Every day | ORAL | Status: DC
  Administered 2021-02-10 – 2021-02-11 (×2): 220 mg via ORAL
  Filled 2021-02-09 (×3): qty 1

## 2021-02-09 MED ORDER — LORAZEPAM 2 MG/ML IJ SOLN: 0.5000 mg | Freq: Four times a day (QID) | INTRAMUSCULAR | Status: DC | PRN

## 2021-02-09 MED ORDER — ADULT MULTIVITAMIN W/MINERALS CH
1.0000 | ORAL_TABLET | Freq: Every day | ORAL | Status: DC
  Administered 2021-02-09 – 2021-02-11 (×3): 1 via ORAL
  Filled 2021-02-09 (×3): qty 1

## 2021-02-09 MED ORDER — OXYCODONE HCL 5 MG PO TABS: 5.0000 mg | ORAL_TABLET | ORAL | Status: DC | PRN

## 2021-02-09 MED ORDER — HYDROCORTISONE NA SUCCINATE PF 100 MG IJ SOLR
100.0000 mg | Freq: Three times a day (TID) | INTRAMUSCULAR | Status: DC
  Administered 2021-02-09 (×3): 100 mg via INTRAVENOUS
  Filled 2021-02-09 (×3): qty 2

## 2021-02-09 MED ORDER — SODIUM CHLORIDE 0.9 % IV BOLUS
1000.0000 mL | Freq: Once | INTRAVENOUS | Status: AC
Start: 2021-02-09 — End: 2021-02-09
  Administered 2021-02-09: 1000 mL via INTRAVENOUS

## 2021-02-09 MED ORDER — ACETAMINOPHEN 325 MG PO TABS: 650.0000 mg | ORAL_TABLET | Freq: Four times a day (QID) | ORAL | Status: DC | PRN

## 2021-02-09 MED ORDER — LEVOTHYROXINE SODIUM 100 MCG PO TABS
100.0000 ug | ORAL_TABLET | Freq: Every day | ORAL | Status: DC
  Administered 2021-02-09 – 2021-02-11 (×3): 100 ug via ORAL
  Filled 2021-02-09 (×3): qty 1

## 2021-02-09 MED ORDER — ONDANSETRON HCL 4 MG/2ML IJ SOLN
4.0000 mg | Freq: Once | INTRAMUSCULAR | Status: AC
  Administered 2021-02-09: 4 mg via INTRAVENOUS
  Filled 2021-02-09: qty 2

## 2021-02-09 MED ORDER — SODIUM CHLORIDE 0.9 % IV SOLN: INTRAVENOUS | Status: DC

## 2021-02-09 MED ORDER — ONDANSETRON HCL 4 MG PO TABS: 4.0000 mg | ORAL_TABLET | Freq: Four times a day (QID) | ORAL | Status: DC | PRN

## 2021-02-09 MED ORDER — ONDANSETRON HCL 4 MG/2ML IJ SOLN: 4.0000 mg | Freq: Four times a day (QID) | INTRAMUSCULAR | Status: DC | PRN

## 2021-02-09 MED ORDER — MULTI-DAY VITAMINS PO TABS: 1.0000 | ORAL_TABLET | Freq: Every day | ORAL | Status: DC

## 2021-02-09 MED ORDER — ZINC GLUCONATE 50 MG PO TABS: 50.0000 mg | ORAL_TABLET | Freq: Every day | ORAL | Status: DC

## 2021-02-09 MED ORDER — ACETAMINOPHEN 650 MG RE SUPP: 650.0000 mg | Freq: Four times a day (QID) | RECTAL | Status: DC | PRN

## 2021-02-09 NOTE — ED Notes (Signed)
Contacted 6th floor- patient's room is ready but no nurse assigned.  "The Charge nurse is looking and will assign"

## 2021-02-09 NOTE — Progress Notes (Signed)
Spoke with IP RN who confirmed pt isolation period ends today and pt ok to admit to dept.

## 2021-02-09 NOTE — ED Notes (Signed)
Patient is alert and oriented.  Getting up to the [facility] to urinate

## 2021-02-09 NOTE — Assessment & Plan Note (Addendum)
-   Diagnosed last hospitalization - Previously completed course of remdesivir.  Currently no respiratory symptoms no hypoxia - Isolation discontinued as she is out of 21-day window - No further work-up or treatment indicated

## 2021-02-09 NOTE — Final Progress Note (Signed)
Progress Note    Michelle Casey   DGU:[id number]  DOB: 12/19/1960  DOA: 02/08/2021     0  PCP: Daisy Floro, MD  CC: Dizziness, nausea  Hospital Course: Michelle Casey is a 60 y.o. female,with history of autoimmune thyroiditis, recently diagnosed with addison's disease presents to the ED with a chief complaint of dizziness and nausea.  Patient was recently admitted on Michelle Casey 20 and discharged on [name] after hospitalization for hyponatremia.  At that time patient initially presented because she had a COVID exposure and was experiencing mild COVID symptoms.  She was diagnosed with COVID but found to have hyponatremia at 114.  Patient complained of shortness of breath, nausea, weakness at that time.  She did have a fever during her hospital course.  She finished a course remdesivir, her hyponatremia was treated, and patient was advised to follow-up closely with endocrinology.   She followed up with endocrinology on Monday, June 6.  She reports that she had an ACTH stimulation test and was diagnosed with Addison's disease.  Upon getting his phone call she became very anxious, and her stress levels have remained high since.  She was told to take hydrocortisone 10 mg in the a.m. and 5 mg at 2 PM.  Patient reports that she has been doing this for 2 days PTA.  She began to feel progressively worse at home and was recommended to present to the ER by her endocrinology office.  Upon arrival to the ER patient had nausea and vomiting. She reports that she has been so weak that she could barely open her eyes so she is not sure exactly what it looks like.  She endorses abdominal pain in her right lower quadrant.  She had normal bowel movements today.  Upon being discharged from the hospital on Michelle Casey 24 she did have some diarrhea but that has resolved.  Patient reports an increase in urine output, no dysuria, no hematuria.  She denies any rashes or skin lesions.  She does report that when she is nauseous she has a chest  heaviness, and palpitations.  She endorses full body tremors, dyspnea, 1 coughing episode productive of white mucus, and a chronically blue tongue for 2 years.  She denies any fevers.  Patient has no other complaints at this time.   In the ED Afebrile with a temperature of 98.2, heart rate 56-59, respiratory rate 23, blood pressure recorded was 94/52 and trended lower some. Satting 100% No leukocytosis with a white blood cell count of 5.9, hemoglobin 12.7 Chemistry panel reveals a hyponatremia 121, low normal potassium at 3.7, hypochloremia at 91, decreased bicarb at 19 EKG shows sinus rhythm with a rate of 64, QTc 465 Patient was given 1 L of normal saline in the ED, and Zofran  Interval History:  Seen in the ER this morning.  Was less anxious appearing and had just finished eating some breakfast.  Reviewed treatment plan including stress dose steroids for now, fluids, and monitoring of labs and vitals.  ROS: Constitutional: negative for chills and fevers, Respiratory: negative for cough and sputum, Cardiovascular: negative for chest pain, and Gastrointestinal: negative for abdominal pain  Assessment & Plan: * Addisonian crisis (HCC) - Hypotension, hyponatremia, hypoglycemia.  Recent diagnosis of Addison's disease via stim test outpatient at her endocrinology office ([name]) - started on stress dose hydrocortisone; continue for now - monitor vitals, glucose, BMP  Hyponatremia - Na 121 on admission - continue IVF and stress dose steroids - BMP in am  Hypotension - Continue fluids and stress dose steroids - See addisonian crisis  Autoimmune thyroiditis - continue synthroid - TSH 0.481 on 02/09/21  COVID-19 virus infection - Diagnosed last hospitalization - Previously completed course of remdesivir.  Currently no respiratory symptoms no hypoxia - Continue isolation - No further work-up or treatment indicated    Old records reviewed in assessment of this  patient  Antimicrobials:   DVT prophylaxis: SCDs Start: 02/09/21 1007   Code Status:   Code Status: Full Code Family Communication:   Disposition Plan: Status is: Inpatient  Remains inpatient appropriate because:IV treatments appropriate due to intensity of illness or inability to take PO and Inpatient level of care appropriate due to severity of illness  Dispo: The patient is from: Home              Anticipated d/c is to: Home              Patient currently is not medically stable to d/c.   Difficult to place patient No      Risk of unplanned readmission score:     Objective: Blood pressure (!) 95/52, pulse 72, temperature 98.6 F (37 C), temperature source Oral, resp. rate 16, height 5\' 6"  (1.676 m), weight 56.7 kg, SpO2 94 %.  Examination: General appearance: alert, cooperative, and no distress Head: Normocephalic, without obvious abnormality, atraumatic Eyes:  EOMI Lungs: clear to auscultation bilaterally Heart: regular rate and rhythm and S1, S2 normal Abdomen: normal findings: bowel sounds normal and soft, non-tender Extremities:  no edema Skin: mobility and turgor normal Neurologic: Grossly normal  Consultants:  N/a  Procedures:  N/a  Data Reviewed: I have personally reviewed following labs and imaging studies Results for orders placed or performed during the hospital encounter of 02/08/21 (from the past 24 hour(s))  Basic metabolic panel     Status: Abnormal   Collection Time: 02/08/21 11:09 PM  Result Value Ref Range   Sodium 121 (L) 135 - 145 mmol/L   Potassium 3.7 3.5 - 5.1 mmol/L   Chloride 91 (L) 98 - 111 mmol/L   CO2 19 (L) 22 - 32 mmol/L   Glucose, Bld 98 70 - 99 mg/dL   BUN 10 6 - 20 mg/dL   Creatinine, Ser 4.780.72 0.44 - 1.00 mg/dL   Calcium 9.6 8.9 - 29.510.3 mg/dL   GFR, Estimated >62>60 >13>60 mL/min   Anion gap 11 5 - 15  CBC     Status: None   Collection Time: 02/08/21 11:09 PM  Result Value Ref Range   WBC 5.9 4.0 - 10.5 K/uL   RBC 4.27 3.87  - 5.11 MIL/uL   Hemoglobin 12.7 12.0 - 15.0 g/dL   HCT 08.636.3 57.836.0 - 46.946.0 %   MCV 85.0 80.0 - 100.0 fL   MCH 29.7 26.0 - 34.0 pg   MCHC 35.0 30.0 - 36.0 g/dL   RDW 62.912.2 52.811.5 - 41.315.5 %   Platelets 267 150 - 400 K/uL   nRBC 0.0 0.0 - 0.2 %  CBG monitoring, ED     Status: Abnormal   Collection Time: 02/08/21 11:35 PM  Result Value Ref Range   Glucose-Capillary 104 (H) 70 - 99 mg/dL  TSH     Status: None   Collection Time: 02/09/21  2:00 AM  Result Value Ref Range   TSH 0.481 0.350 - 4.500 uIU/mL  Urinalysis, Routine w reflex microscopic     Status: Abnormal   Collection Time: 02/09/21  2:40 AM  Result Value Ref  Range   Color, Urine STRAW (A) YELLOW   APPearance CLEAR CLEAR   Specific Gravity, Urine 1.006 1.005 - 1.030   pH 7.0 5.0 - 8.0   Glucose, UA NEGATIVE NEGATIVE mg/dL   Hgb urine dipstick NEGATIVE NEGATIVE   Bilirubin Urine NEGATIVE NEGATIVE   Ketones, ur 20 (A) NEGATIVE mg/dL   Protein, ur NEGATIVE NEGATIVE mg/dL   Nitrite NEGATIVE NEGATIVE   Leukocytes,Ua NEGATIVE NEGATIVE  Osmolality, urine     Status: Abnormal   Collection Time: 02/09/21  2:40 AM  Result Value Ref Range   Osmolality, Ur 271 (L) 300 - 900 mOsm/kg  Na and K (sodium & potassium), rand urine     Status: None   Collection Time: 02/09/21  2:40 AM  Result Value Ref Range   Sodium, Ur 72 mmol/L   Potassium Urine 15 mmol/L  Resp Panel by RT-PCR (Flu A&B, Covid) Nasopharyngeal Swab     Status: Abnormal   Collection Time: 02/09/21  3:10 AM   Specimen: Nasopharyngeal Swab; Nasopharyngeal(NP) swabs in vial transport medium  Result Value Ref Range   SARS Coronavirus 2 by RT PCR POSITIVE (A) NEGATIVE   Influenza A by PCR NEGATIVE NEGATIVE   Influenza B by PCR NEGATIVE NEGATIVE  Lactic acid, plasma     Status: Abnormal   Collection Time: 02/09/21  3:10 AM  Result Value Ref Range   Lactic Acid, Venous 2.3 (HH) 0.5 - 1.9 mmol/L  Basic metabolic panel     Status: Abnormal   Collection Time: 02/09/21  3:30 AM   Result Value Ref Range   Sodium 123 (L) 135 - 145 mmol/L   Potassium 3.7 3.5 - 5.1 mmol/L   Chloride 98 98 - 111 mmol/L   CO2 17 (L) 22 - 32 mmol/L   Glucose, Bld 107 (H) 70 - 99 mg/dL   BUN 8 6 - 20 mg/dL   Creatinine, Ser 2.13 0.44 - 1.00 mg/dL   Calcium 8.8 (L) 8.9 - 10.3 mg/dL   GFR, Estimated >08 >65 mL/min   Anion gap 8 5 - 15  Blood gas, venous     Status: Abnormal   Collection Time: 02/09/21  3:30 AM  Result Value Ref Range   pH, Ven 7.502 (H) 7.250 - 7.430   pCO2, Ven 13.4 (LL) 44.0 - 60.0 mmHg   pO2, Ven 160.0 (H) 32.0 - 45.0 mmHg   Bicarbonate 10.4 (L) 20.0 - 28.0 mmol/L   Acid-base deficit 11.8 (H) 0.0 - 2.0 mmol/L   O2 Saturation 99.6 %   Patient temperature 98.6   Magnesium     Status: None   Collection Time: 02/09/21 10:30 AM  Result Value Ref Range   Magnesium 2.1 1.7 - 2.4 mg/dL    Recent Results (from the past 240 hour(s))  Resp Panel by RT-PCR (Flu A&B, Covid) Nasopharyngeal Swab     Status: Abnormal   Collection Time: 02/09/21  3:10 AM   Specimen: Nasopharyngeal Swab; Nasopharyngeal(NP) swabs in vial transport medium  Result Value Ref Range Status   SARS Coronavirus 2 by RT PCR POSITIVE (A) NEGATIVE Final    Comment: RESULT CALLED TO, READ BACK BY AND VERIFIED WITH: KYRA, RN @ 0410 ON 02/09/21 C VARNER (NOTE) SARS-CoV-2 target nucleic acids are DETECTED.  The SARS-CoV-2 RNA is generally detectable in upper respiratory specimens during the acute phase of infection. Positive results are indicative of the presence of the identified virus, but do not rule out bacterial infection or co-infection with other pathogens not detected  by the test. Clinical correlation with patient history and other diagnostic information is necessary to determine patient infection status. The expected result is Negative.  Fact Sheet for Patients: BloggerCourse.com  Fact Sheet for Healthcare Providers: SeriousBroker.it  This  test is not yet approved or cleared by the [place] FDA and  has been authorized for detection and/or diagnosis of SARS-CoV-2 by FDA under an Emergency Use Authorization (EUA).  This EUA will remain in effect (meaning this test can b e used) for the duration of  the COVID-19 declaration under Section 564(b)(1) of the Act, 21 U.S.C. section 360bbb-3(b)(1), unless the authorization is terminated or revoked sooner.     Influenza A by PCR NEGATIVE NEGATIVE Final   Influenza B by PCR NEGATIVE NEGATIVE Final    Comment: (NOTE) The Xpert Xpress SARS-CoV-2/FLU/RSV plus assay is intended as an aid in the diagnosis of influenza from Nasopharyngeal swab specimens and should not be used as a sole basis for treatment. Nasal washings and aspirates are unacceptable for Xpert Xpress SARS-CoV-2/FLU/RSV testing.  Fact Sheet for Patients: BloggerCourse.com  Fact Sheet for Healthcare Providers: SeriousBroker.it  This test is not yet approved or cleared by the [place] FDA and has been authorized for detection and/or diagnosis of SARS-CoV-2 by FDA under an Emergency Use Authorization (EUA). This EUA will remain in effect (meaning this test can be used) for the duration of the COVID-19 declaration under Section 564(b)(1) of the Act, 21 U.S.C. section 360bbb-3(b)(1), unless the authorization is terminated or revoked.  Performed at [facility], [address] [place], [place] [address]      Radiology Studies: No results found. No orders to display    Scheduled Meds:  hydrocortisone sod succinate (SOLU-CORTEF) inj  100 mg Intravenous Q8H   levothyroxine  100 mcg Oral Daily   multivitamin with minerals  1 tablet Oral Daily   [START ON 02/10/2021] zinc sulfate  220 mg Oral Daily   PRN Meds: acetaminophen **OR** acetaminophen, LORazepam, ondansetron **OR** ondansetron (ZOFRAN) IV, oxyCODONE Continuous Infusions:   sodium chloride Stopped (02/09/21 1232)     LOS: 0 days  Time spent: Greater than 50% of the 35 minute visit was spent in counseling/coordination of care for the patient as laid out in the A&P.   Lewie Chamber, MD [facility] 02/09/2021, 3:18 PM

## 2021-02-09 NOTE — Assessment & Plan Note (Signed)
-   continue synthroid - TSH 0.481 on 02/09/21

## 2021-02-09 NOTE — Assessment & Plan Note (Addendum)
-   Na 121 on admission -Improved and corrected with fluids and steroids - Na 139 at discharge and had been holding steady

## 2021-02-09 NOTE — ED Provider Notes (Signed)
[facility]-EMERGENCY DEPT Provider Note   CSN: [id number] Arrival date & time: 02/08/21  2234     History Chief Complaint  Patient presents with   Dizziness    Michelle Casey is a 60 y.o. female.  Patient is a 60 year old female with history of autoimmune thyroiditis, recent diagnosis of Addison's disease.  Patient admitted in [facility] with sodium level of 114.  In follow-up with endocrinology, the formal diagnosis of Addison's disease was made.  She was started this morning on hydrocortisone and has taken 1 dose of this.  Patient presents today with complaints of generalized weakness, nausea, and feeling the same as when she was admitted previously.  She denies any vomiting or diarrhea.  The history is provided by the patient.  Dizziness Quality:  Lightheadedness Severity:  Moderate Onset quality:  Sudden Duration:  1 day Timing:  Constant Progression:  Worsening Chronicity:  Recurrent Relieved by:  Nothing Worsened by:  Nothing Ineffective treatments:  None tried     Past Medical History:  Diagnosis Date   Autoimmune thyroiditis 01/20/2021   Hypothyroidism 05/16/2020   Stroke [facility])     Patient Active Problem List   Diagnosis Date Noted   COVID-19 virus infection 01/20/2021   Autoimmune thyroiditis 01/20/2021   Hyponatremia 01/20/2021   Abnormal LFTs 01/20/2021   Raised serum iron 01/20/2021   Hypothyroidism 05/16/2020    No past surgical history on file.   OB History   No obstetric history on file.     Family History  Problem Relation Age of Onset   Heart attack Neg Hx     Social History   Tobacco Use   Smoking status: Never Smoker   Smokeless tobacco: Never Used    Home Medications Prior to Admission medications   Medication Sig Start Date End Date Taking? Authorizing Provider  Cholecalciferol (VITAMIN D3) 125 MCG (5000 UT) CAPS Take 5,000 Units by mouth daily. 05/26/20   [provider]  levothyroxine (SYNTHROID) 100 MCG  tablet Take 100 mcg by mouth daily.    [provider]  Multiple Vitamin (MULTI-DAY VITAMINS) TABS Take 1 tablet by mouth daily.    [provider]  ondansetron (ZOFRAN-ODT) 4 MG disintegrating tablet Take 4 mg by mouth every 8 (eight) hours as needed for nausea/vomiting. 01/19/21   [provider]  zinc gluconate 50 MG tablet Take 50 mg by mouth daily.    [provider]    Allergies    Carbimazole  Review of Systems   Review of Systems  Neurological:  Positive for dizziness.  All other systems reviewed and are negative.  Physical Exam Updated Vital Signs BP (!) 95/53   Pulse (!) 56   Temp 98.2 F (36.8 C) (Oral)   Resp 19   Ht 5\' 6"  (1.676 m)   Wt 56.7 kg   SpO2 97%   BMI 20.18 kg/m   Physical Exam Vitals and nursing note reviewed.  Constitutional:      General: She is not in acute distress.    Appearance: She is well-developed. She is not diaphoretic.  HENT:     Head: Normocephalic and atraumatic.  Cardiovascular:     Rate and Rhythm: Normal rate and regular rhythm.     Heart sounds: No murmur heard.   No friction rub. No gallop.  Pulmonary:     Effort: Pulmonary effort is normal. No respiratory distress.     Breath sounds: Normal breath sounds. No wheezing.  Abdominal:  General: Bowel sounds are normal. There is no distension.     Palpations: Abdomen is soft.     Tenderness: There is no abdominal tenderness.  Musculoskeletal:        General: Normal range of motion.     Cervical back: Normal range of motion and neck supple.  Skin:    General: Skin is warm and dry.  Neurological:     General: No focal deficit present.     Mental Status: She is alert and oriented to person, place, and time.     Cranial Nerves: No cranial nerve deficit.     Sensory: No sensory deficit.     Motor: No weakness.    ED Results / Procedures / Treatments   Labs (all labs ordered are listed, but only abnormal results are displayed) Labs  Reviewed  CBG MONITORING, ED - Abnormal; Notable for the following components:      Result Value   Glucose-Capillary 104 (*)    All other components within normal limits  BASIC METABOLIC PANEL  CBC  URINALYSIS, ROUTINE W REFLEX MICROSCOPIC    EKG EKG Interpretation  Date/Time:  Wednesday February 08 2021 23:05:57 EDT Ventricular Rate:  64 PR Interval:  194 QRS Duration: 90 QT Interval:  450 QTC Calculation: 465 R Axis:   87 Text Interpretation: Sinus rhythm Consider left atrial enlargement ST elevation, early repolarization Confirmed by Geoffery Lyons ([id number]) on 02/09/2021 12:19:13 AM   Radiology No results found.  Procedures Procedures   Medications Ordered in ED Medications  sodium chloride 0.9 % bolus 1,000 mL (has no administration in time range)  ondansetron (ZOFRAN) injection 4 mg (has no administration in time range)    ED Course  I have reviewed the triage vital signs and the nursing notes.  Pertinent labs & imaging results that were available during my care of the patient were reviewed by me and considered in my medical decision making (see chart for details).    MDM Rules/Calculators/A&P   Patient presenting with complaints of weakness.  She has history of recently diagnosed Addison's disease.  Patient is hypotensive with blood pressure of 90 systolic.  Her sodium is 121.  Both of these are indicative of an addisonian crisis.  I feels the patient will require admission.  I have spoken with Dr. Carren Rang who agrees to admit.  CRITICAL CARE Performed by: Geoffery Lyons Total critical care time: 35 minutes Critical care time was exclusive of separately billable procedures and treating other patients. Critical care was necessary to treat or prevent imminent or life-threatening deterioration. Critical care was time spent personally by me on the following activities: development of treatment plan with patient and/or surrogate as well as nursing, discussions with  consultants, evaluation of patient's response to treatment, examination of patient, obtaining history from patient or surrogate, ordering and performing treatments and interventions, ordering and review of laboratory studies, ordering and review of radiographic studies, pulse oximetry and re-evaluation of patient's condition.   Final Clinical Impression(s) / ED Diagnoses Final diagnoses:  None    Rx / DC Orders ED Discharge Orders     None        Geoffery Lyons, MD 02/09/21 [phone]

## 2021-02-09 NOTE — Hospital Course (Signed)
Michelle Casey is a 60 y.o. female,with history of autoimmune thyroiditis, recently diagnosed with addison's disease presents to the ED with a chief complaint of dizziness and nausea.  Patient was recently admitted on [name] and discharged on [name] after hospitalization for hyponatremia.  At that time patient initially presented because she had a COVID exposure and was experiencing mild COVID symptoms.  She was diagnosed with COVID but found to have hyponatremia at 114.  Patient complained of shortness of breath, nausea, weakness at that time.  She did have a fever during her hospital course.  She finished a course remdesivir, her hyponatremia was treated, and patient was advised to follow-up closely with endocrinology.   She followed up with endocrinology on Monday, June 6.  She reports that she had an ACTH stimulation test and was diagnosed with Addison's disease.  Upon getting his phone call she became very anxious, and her stress levels have remained high since.  She was told to take hydrocortisone 10 mg in the a.m. and 5 mg at 2 PM.  Patient reports that she has been doing this for 2 days PTA.  She began to feel progressively worse at home and was recommended to present to the ER by her endocrinology office.  Upon arrival to the ER patient had nausea and vomiting. She reports that she has been so weak that she could barely open her eyes so she is not sure exactly what it looks like.  She endorses abdominal pain in her right lower quadrant.  She had normal bowel movements today.  Upon being discharged from the hospital on [name] she did have some diarrhea but that has resolved.  Patient reports an increase in urine output, no dysuria, no hematuria.  She denies any rashes or skin lesions.  She does report that when she is nauseous she has a chest heaviness, and palpitations.  She endorses full body tremors, dyspnea, 1 coughing episode productive of white mucus, and a chronically blue tongue for 2 years.  She  denies any fevers.  Patient has no other complaints at this time.   In the ED Afebrile with a temperature of 98.2, heart rate 56-59, respiratory rate 23, blood pressure recorded was 94/52 and trended lower some. Satting 100% No leukocytosis with a white blood cell count of 5.9, hemoglobin 12.7 Chemistry panel reveals a hyponatremia 121, low normal potassium at 3.7, hypochloremia at 91, decreased bicarb at 19 EKG shows sinus rhythm with a rate of 64, QTc 465 Patient was given 1 L of normal saline in the ED, and Zofran

## 2021-02-09 NOTE — Assessment & Plan Note (Addendum)
-   s/p fluids and stress dose steroids - See addisonian crisis

## 2021-02-09 NOTE — Assessment & Plan Note (Addendum)
-   Hypotension, hyponatremia, hypoglycemia.  Recent diagnosis of Addison's disease via stim test outpatient at her endocrinology office ([name]) - started on stress dose hydrocortisone; continue for now; decrease and will send on adjusted home dose at discharge (hydrocortisone 20 mg in am x 3 days and 10 mg pm, and patient will follow up with endocrinology on 6/13 for further adjustment) - she has outpatient follow up on Monday with [name] as well - negative orthostatic BP prior to discharge; BP holding stable and glucose/Na also improved and stable

## 2021-02-09 NOTE — ED Notes (Signed)
Gaynelle Adu, RN has reviewed admission information.  Patient is ready for admission

## 2021-02-09 NOTE — ED Notes (Signed)
The patient was notified of the admission bed assignment room 619

## 2021-02-09 NOTE — H&P (Signed)
TRH H&P    Patient Demographics:    Michelle Casey, is a 60 y.o. female  MRN: [id number]  DOB - November 29, 1960  Admit Date - 02/08/2021  Referring MD/NP/PA: Judd Lienelo  Outpatient Primary MD for the patient is Daisy Florooss, Charles Alan, MD  Patient coming from: Home  Chief complaint- Dizziness, nausea   HPI:    Michelle Casey  is a 60 y.o. female,with history of autoimmune thyroiditis, recently diagnosed with addison's disease presents to the ED with a chief complaint of dizziness and nausea.  Patient was recently admitted on Audrea 20 and discharged on Debria 24 after hospitalization for hyponatremia.  At that time patient initially presented because she had a COVID exposure and was experiencing mild COVID symptoms.  She was diagnosed with COVID but found to have a hyponatremia at 114.  Patient complained of shortness of breath, nausea, weakness at that time.  She did have a fever during her hospital course.  She finished a course remdesivir, her hyponatremia was treated, and patient was advised to follow-up closely with endocrinology.  She followed up with endocrinology on Monday, June 6.  She reports that she had an ACTH stimulation test and was diagnosed with Addison's disease.  She reports that her endocrinologist told her she had severe Addison's disease.  Upon getting his phone call she became very anxious, and her stress levels have remained high since.  She was told to take hydrocortisone 10 mg in the a.m. and 5 mg at 2 PM.  Patient reports that she has been doing this for 2 days now.  Yesterday she took 10 mg around 6 AM, had no appetite but did try to eat some toast, and did take her levothyroxine.  She felt dizzy and lightheaded.  She messaged her endocrinologist around 11 AM.  He messaged her back to keep taking the medication and after 3 days she should feel better.  At 2 PM she took the 5 mg dose.  She continued to feel dizzy, nauseous,  lightheaded.  At 9 PM she called the on-call number for endocrinology.  They advised her to take Zofran, and 15 minutes later she should feel better.  They also advised her to drink some Gatorade.  She did those things and when she still did not feel better she called them again.  Advised her to go to the ER for possible Addison's crisis.  Upon arrival to the ER patient had nausea and vomiting.  This was the first episode of emesis today.  She reports it was orange in color she thinks.  She reports that she has been so weak that she could barely open her eyes so she is not sure exactly what it looks like.  She endorses abdominal pain in her right lower quadrant.  She had normal bowel movements today.  Upon being discharged from the hospital on Anola 24 she did have some diarrhea but that has resolved.  Patient reports an increase in urine output, no dysuria, no hematuria.  She denies any rashes or skin lesions.  She does report  that when she is nauseous she has a chest heaviness, and palpitations.  She endorses full body tremors, dyspnea, 1 coughing episode productive of white mucus, and a chronically blue tongue for 2 years.  She denies any fevers.  Patient has no other complaints at this time.  In the ED Afebrile with a temperature of 98.2, heart rate 56-59, respiratory rate 23, blood pressure recorded was 94/52, but during my examination was 70s over 50s with a MAP of 62 Satting 100% No leukocytosis with a white blood cell count of 5.9, hemoglobin 12.7 Chemistry panel reveals a hyponatremia 121, low normal potassium at 3.7, hypochloremia at 91, decreased bicarb at 19 EKG shows sinus rhythm with a rate of 64, QTc 465 Patient was given 1 L of normal saline in the ED, and Zofran.     Review of systems:    In addition to the HPI above,  No Fever-chills, No Headache, No changes with Vision or hearing, No problems swallowing food or Liquids, Admits to chest heaviness, cough and shortness of  breath Admits to abdominal pain, nausea, 1 episode of emesis, bowel movements are regular, No Blood in stool or Urine, No dysuria, No new skin rashes or bruises, No new joints pains-aches,  No new weakness, tingling, numbness in any extremity, No recent weight gain or loss, No polyuria, polydypsia or polyphagia, Admits to significant stress secondary to Addison's diagnosis  All other systems reviewed and are negative.    Past History of the following :    Past Medical History:  Diagnosis Date   Autoimmune thyroiditis 01/20/2021   Hypothyroidism 05/16/2020   Stroke (HCC)       No past surgical history on file.    Social History:      Social History   Tobacco Use   Smoking status: Never   Smokeless tobacco: Never  Substance Use Topics   Alcohol use: Not on file       Family History :     Family History  Problem Relation Age of Onset   Heart attack Neg Hx       Home Medications:   Prior to Admission medications   Medication Sig Start Date End Date Taking? Authorizing Provider  Cholecalciferol (VITAMIN D3) 125 MCG (5000 UT) CAPS Take 5,000 Units by mouth daily. 05/26/20   [provider]  levothyroxine (SYNTHROID) 100 MCG tablet Take 100 mcg by mouth daily.    [provider]  Multiple Vitamin (MULTI-DAY VITAMINS) TABS Take 1 tablet by mouth daily.    [provider]  ondansetron (ZOFRAN-ODT) 4 MG disintegrating tablet Take 4 mg by mouth every 8 (eight) hours as needed for nausea/vomiting. 01/19/21   [provider]  zinc gluconate 50 MG tablet Take 50 mg by mouth daily.    [provider]     Allergies:     Allergies  Allergen Reactions   Carbimazole Itching and Rash     Physical Exam:   Vitals  Blood pressure (!) 97/51, pulse 64, temperature 98.2 F (36.8 C), temperature source Oral, resp. rate 16, height 5\' 6"  (1.676 m), weight 56.7 kg, SpO2 99 %.  1.  General: Patient lying supine in bed,  no acute  distress   2. Psychiatric: Alert and oriented x 3, anxious, behavior normal for situation, pleasant and cooperative with exam   3. Neurologic: Speech and language are normal, face is symmetric, moves all 4 extremities voluntarily, at baseline without acute deficits on limited exam   4. HEENMT:  Head is atraumatic, normocephalic, pupils reactive to light, neck is supple, trachea is midline, mucous membranes are mildly dry, and tongue is bluish in color   5. Respiratory : Lungs are clear to auscultation bilaterally without wheezing, rhonchi, rales, no cyanosis, no increase in work of breathing or accessory muscle use   6. Cardiovascular : Heart rate normal, rhythm is regular, no murmurs, rubs or gallops, no peripheral edema, peripheral pulses palpated   7. Gastrointestinal:  Abdomen is soft, nondistended, nontender to palpation bowel sounds active, no masses or organomegaly palpated   8. Skin:  Skin is warm, dry and intact without rashes, acute lesions, or ulcers on limited exam   9.Musculoskeletal:  No acute deformities or trauma, no asymmetry in tone, no peripheral edema, peripheral pulses palpated, no tenderness to palpation in the extremities    Data Review:    CBC Recent Labs  Lab 02/08/21 2309  WBC 5.9  HGB 12.7  HCT 36.3  PLT 267  MCV 85.0  MCH 29.7  MCHC 35.0  RDW 12.2   ------------------------------------------------------------------------------------------------------------------  Results for orders placed or performed during the hospital encounter of 02/08/21 (from the past 48 hour(s))  Basic metabolic panel     Status: Abnormal   Collection Time: 02/08/21 11:09 PM  Result Value Ref Range   Sodium 121 (L) 135 - 145 mmol/L   Potassium 3.7 3.5 - 5.1 mmol/L   Chloride 91 (L) 98 - 111 mmol/L   CO2 19 (L) 22 - 32 mmol/L   Glucose, Bld 98 70 - 99 mg/dL    Comment: Glucose reference range applies only to samples taken after fasting for at least 8 hours.   BUN  10 6 - 20 mg/dL   Creatinine, Ser 5.46 0.44 - 1.00 mg/dL   Calcium 9.6 8.9 - 27.0 mg/dL   GFR, Estimated >35 >00 mL/min    Comment: (NOTE) Calculated using the CKD-EPI Creatinine Equation (2021)    Anion gap 11 5 - 15    Comment: Performed at [facility], [address], [place], [place] [address]  CBC     Status: None   Collection Time: 02/08/21 11:09 PM  Result Value Ref Range   WBC 5.9 4.0 - 10.5 K/uL   RBC 4.27 3.87 - 5.11 MIL/uL   Hemoglobin 12.7 12.0 - 15.0 g/dL   HCT 29.9 37.1 - 69.6 %   MCV 85.0 80.0 - 100.0 fL   MCH 29.7 26.0 - 34.0 pg   MCHC 35.0 30.0 - 36.0 g/dL   RDW 78.9 38.1 - 01.7 %   Platelets 267 150 - 400 K/uL   nRBC 0.0 0.0 - 0.2 %    Comment: Performed at [facility], [address], [place], [place] [address]  CBG monitoring, ED     Status: Abnormal   Collection Time: 02/08/21 11:35 PM  Result Value Ref Range   Glucose-Capillary 104 (H) 70 - 99 mg/dL    Comment: Glucose reference range applies only to samples taken after fasting for at least 8 hours.  Urinalysis, Routine w reflex microscopic     Status: Abnormal   Collection Time: 02/09/21  2:40 AM  Result Value Ref Range   Color, Urine STRAW (A) YELLOW   APPearance CLEAR CLEAR   Specific Gravity, Urine 1.006 1.005 - 1.030   pH 7.0 5.0 - 8.0   Glucose, UA NEGATIVE NEGATIVE mg/dL   Hgb urine dipstick NEGATIVE NEGATIVE   Bilirubin Urine NEGATIVE NEGATIVE   Ketones, ur  20 (A) NEGATIVE mg/dL   Protein, ur NEGATIVE NEGATIVE mg/dL   Nitrite NEGATIVE NEGATIVE   Leukocytes,Ua NEGATIVE NEGATIVE    Comment: Performed at [facility], [address], [place], [place] [address]    Chemistries  Recent Labs  Lab 02/08/21 2309  NA 121*  K 3.7  CL 91*  CO2 19*  GLUCOSE 98  BUN 10  CREATININE 0.72  CALCIUM 9.6    ------------------------------------------------------------------------------------------------------------------  ------------------------------------------------------------------------------------------------------------------ GFR: Estimated Creatinine Clearance: 69.4 mL/min (by C-G formula based on SCr of 0.72 mg/dL). Liver Function Tests: No results for input(s): AST, ALT, ALKPHOS, BILITOT, PROT, ALBUMIN in the last 168 hours. No results for input(s): LIPASE, AMYLASE in the last 168 hours. No results for input(s): AMMONIA in the last 168 hours. Coagulation Profile: No results for input(s): INR, PROTIME in the last 168 hours. Cardiac Enzymes: No results for input(s): CKTOTAL, CKMB, CKMBINDEX, TROPONINI in the last 168 hours. BNP (last 3 results) No results for input(s): PROBNP in the last 8760 hours. HbA1C: No results for input(s): HGBA1C in the last 72 hours. CBG: Recent Labs  Lab 02/08/21 2335  GLUCAP 104*   Lipid Profile: No results for input(s): CHOL, HDL, LDLCALC, TRIG, CHOLHDL, LDLDIRECT in the last 72 hours. Thyroid Function Tests: No results for input(s): TSH, T4TOTAL, FREET4, T3FREE, THYROIDAB in the last 72 hours. Anemia Panel: No results for input(s): VITAMINB12, FOLATE, FERRITIN, TIBC, IRON, RETICCTPCT in the last 72 hours.  --------------------------------------------------------------------------------------------------------------- Urine analysis:    Component Value Date/Time   COLORURINE STRAW (A) 02/09/2021 0240   APPEARANCEUR CLEAR 02/09/2021 0240   LABSPEC 1.006 02/09/2021 0240   PHURINE 7.0 02/09/2021 0240   GLUCOSEU NEGATIVE 02/09/2021 0240   HGBUR NEGATIVE 02/09/2021 0240   BILIRUBINUR NEGATIVE 02/09/2021 0240   KETONESUR 20 (A) 02/09/2021 0240   PROTEINUR NEGATIVE 02/09/2021 0240   NITRITE NEGATIVE 02/09/2021 0240   LEUKOCYTESUR NEGATIVE 02/09/2021 0240      Imaging Results:    No results found.  My personal review of EKG: Rhythm  NSR, Rate 64/min, QTc 465 ,no Acute ST changes   Assessment & Plan:    Principal Problem:   Addisonian crisis (HCC) Active Problems:   Autoimmune thyroiditis   Hyponatremia   Hypotension   Addison's crisis With hyponatremia at 121 Bicarb down to 19 Hypotensive with BP at time of exam 70s/50s K+ is low normal Nausea, vomiting, weakness, dizziness Likely trigger is a combination of hypovolemic status in the setting of poor appetite and poor p.o. intake, as well as elevated stress/anxiety from recent Addison's diagnosis Start solucortef 100mg  TID Urine studies pending Continue to monitor Autoimmune thyroiditis Continue synthroid Last TSH 01-20-21 = 1.049 Hyponatremia 2/2 #1 Also poor p.o. intake secondary to poor appetite contributing 1 L NS in ED Continue IVF hydration with q8 H BMP Continue to monitor Hypotension 2/2 to #1 1 L Fluids in ED Should improve with solucortef as mentioned in #1 Continue IV fluids Continue to monitor Covid 19 Diagnosed with the last admission Completed remdesivir No respiratory symptoms Continue isolation Continue to monitor   DVT Prophylaxis-   SCDs  AM Labs Ordered, also please review Full Orders  Family Communication: No family at bedside.   Code Status:  Full  Admission status: Observation Time spent in minutes : 65   Garth Diffley B Zierle-Ghosh DO

## 2021-02-10 DIAGNOSIS — E063 Autoimmune thyroiditis: Secondary | ICD-10-CM

## 2021-02-10 DIAGNOSIS — E272 Addisonian crisis: Principal | ICD-10-CM

## 2021-02-10 DIAGNOSIS — E871 Hypo-osmolality and hyponatremia: Secondary | ICD-10-CM

## 2021-02-10 LAB — CBC WITH DIFFERENTIAL/PLATELET
Abs Immature Granulocytes: 0.02 10*3/uL (ref 0.00–0.07)
Basophils Absolute: 0 10*3/uL (ref 0.0–0.1)
Basophils Relative: 0 %
Eosinophils Absolute: 0 10*3/uL (ref 0.0–0.5)
Eosinophils Relative: 0 %
HCT: 33.8 % — ABNORMAL LOW (ref 36.0–46.0)
Hemoglobin: 11.3 g/dL — ABNORMAL LOW (ref 12.0–15.0)
Immature Granulocytes: 0 %
Lymphocytes Relative: 24 %
Lymphs Abs: 1.7 10*3/uL (ref 0.7–4.0)
MCH: 30 pg (ref 26.0–34.0)
MCHC: 33.4 g/dL (ref 30.0–36.0)
MCV: 89.7 fL (ref 80.0–100.0)
Monocytes Absolute: 0.5 10*3/uL (ref 0.1–1.0)
Monocytes Relative: 7 %
Neutro Abs: 4.8 10*3/uL (ref 1.7–7.7)
Neutrophils Relative %: 69 %
Platelets: 251 10*3/uL (ref 150–400)
RBC: 3.77 MIL/uL — ABNORMAL LOW (ref 3.87–5.11)
RDW: 13 % (ref 11.5–15.5)
WBC: 7.1 10*3/uL (ref 4.0–10.5)
nRBC: 0 % (ref 0.0–0.2)

## 2021-02-10 LAB — BASIC METABOLIC PANEL
Anion gap: 8 (ref 5–15)
BUN: 12 mg/dL (ref 6–20)
CO2: 20 mmol/L — ABNORMAL LOW (ref 22–32)
Calcium: 9.1 mg/dL (ref 8.9–10.3)
Chloride: 108 mmol/L (ref 98–111)
Creatinine, Ser: 0.56 mg/dL (ref 0.44–1.00)
GFR, Estimated: >60 mL/min (ref >60)
Glucose, Bld: 109 mg/dL — ABNORMAL HIGH (ref 70–99)
Potassium: 3.8 mmol/L (ref 3.5–5.1)
Sodium: 136 mmol/L (ref 135–145)

## 2021-02-10 LAB — MAGNESIUM: Magnesium: 2.2 mg/dL (ref 1.7–2.4)

## 2021-02-10 MED ORDER — ACETAMINOPHEN 325 MG PO TABS: 650.0000 mg | ORAL_TABLET | ORAL | Status: DC | PRN

## 2021-02-10 MED ORDER — HYDROCORTISONE NA SUCCINATE PF 100 MG IJ SOLR
50.0000 mg | Freq: Three times a day (TID) | INTRAMUSCULAR | Status: DC
  Administered 2021-02-10 – 2021-02-11 (×4): 50 mg via INTRAVENOUS
  Filled 2021-02-10 (×4): qty 2

## 2021-02-10 MED ORDER — ACETAMINOPHEN 650 MG RE SUPP: 650.0000 mg | Freq: Four times a day (QID) | RECTAL | Status: DC | PRN

## 2021-02-10 NOTE — Progress Notes (Signed)
Pt complained of headache overnight. She believes its from"the high steroid dose" Pt is requesting an endocrinologist to see her.

## 2021-02-10 NOTE — Progress Notes (Signed)
Progress Note    Michelle Casey   OBS:[id number]  DOB: 02-27-1961  DOA: 02/08/2021     1  PCP: Daisy Floro, MD  CC: Dizziness, nausea  Hospital Course: Michelle Casey is a 60 y.o. female,with history of autoimmune thyroiditis, recently diagnosed with addison's disease presents to the ED with a chief complaint of dizziness and nausea.  Patient was recently admitted on [name] and discharged on Rachna 24 after hospitalization for hyponatremia.  At that time patient initially presented because she had a COVID exposure and was experiencing mild COVID symptoms.  She was diagnosed with COVID but found to have hyponatremia at 114.  Patient complained of shortness of breath, nausea, weakness at that time.  She did have a fever during her hospital course.  She finished a course remdesivir, her hyponatremia was treated, and patient was advised to follow-up closely with endocrinology.   She followed up with endocrinology on Monday, June 6.  She reports that she had an ACTH stimulation test and was diagnosed with Addison's disease.  Upon getting his phone call she became very anxious, and her stress levels have remained high since.  She was told to take hydrocortisone 10 mg in the a.m. and 5 mg at 2 PM.  Patient reports that she has been doing this for 2 days PTA.  She began to feel progressively worse at home and was recommended to present to the ER by her endocrinology office.  Upon arrival to the ER patient had nausea and vomiting. She reports that she has been so weak that she could barely open her eyes so she is not sure exactly what it looks like.  She endorses abdominal pain in her right lower quadrant.  She had normal bowel movements today.  Upon being discharged from the hospital on [name] she did have some diarrhea but that has resolved.  Patient reports an increase in urine output, no dysuria, no hematuria.  She denies any rashes or skin lesions.  She does report that when she is nauseous she has a chest  heaviness, and palpitations.  She endorses full body tremors, dyspnea, 1 coughing episode productive of white mucus, and a chronically blue tongue for 2 years.  She denies any fevers.  Patient has no other complaints at this time.   In the ED Afebrile with a temperature of 98.2, heart rate 56-59, respiratory rate 23, blood pressure recorded was 94/52 and trended lower some. Satting 100% No leukocytosis with a white blood cell count of 5.9, hemoglobin 12.7 Chemistry panel reveals a hyponatremia 121, low normal potassium at 3.7, hypochloremia at 91, decreased bicarb at 19 EKG shows sinus rhythm with a rate of 64, QTc 465 Patient was given 1 L of normal saline in the ED, and Zofran  Interval History:  No events overnight.  Complaining of a headache this morning.  Instructed her that it is okay to take Tylenol for this.  COVID also is known to cause severe headaches which I reminded her about. Steroids decreased this morning and tentative plan is for discharge home tomorrow.  ROS: Constitutional: negative for chills and fevers, Respiratory: negative for cough and sputum, Cardiovascular: negative for chest pain, and Gastrointestinal: negative for abdominal pain  Assessment & Plan: * Addisonian crisis (HCC) - Hypotension, hyponatremia, hypoglycemia.  Recent diagnosis of Addison's disease via stim test outpatient at her endocrinology office ([name]) - started on stress dose hydrocortisone; continue for now; decrease and will send on adjusted home dose at discharge (  hydrocortisone 20 mg in am x 3 days and 10 mg pm x 3 days) - she has outpatient follow up on Monday with [name] as well - monitor vitals, glucose, BMP  Hyponatremia-resolved as of 02/10/2021 - Na 121 on admission -Improved and corrected with fluids and steroids - Fluids have been stopped - BMP in am  Hypotension - Continue fluids and stress dose steroids - See addisonian crisis  Autoimmune thyroiditis - continue synthroid -  TSH 0.481 on 02/09/21  COVID-19 virus infection-resolved as of 02/10/2021 - Diagnosed last hospitalization - Previously completed course of remdesivir.  Currently no respiratory symptoms no hypoxia - Isolation discontinued as she is out of 21-day window - No further work-up or treatment indicated   Old records reviewed in assessment of this patient  Antimicrobials:   DVT prophylaxis: SCDs Start: 02/09/21 1007   Code Status:   Code Status: Full Code Family Communication:   Disposition Plan: Status is: Inpatient  Remains inpatient appropriate because:IV treatments appropriate due to intensity of illness or inability to take PO and Inpatient level of care appropriate due to severity of illness  Dispo: The patient is from: Home              Anticipated d/c is to: Home              Patient currently is not medically stable to d/c.   Difficult to place patient No  Risk of unplanned readmission score: Unplanned Admission- Pilot do not use: 10.98   Objective: Blood pressure (!) 102/50, pulse 79, temperature 98.3 F (36.8 C), temperature source Oral, resp. rate 17, height 5\' 6"  (1.676 m), weight 56.7 kg, SpO2 97 %.  Examination: General appearance: alert, cooperative, and no distress Head: Normocephalic, without obvious abnormality, atraumatic Eyes:  EOMI Lungs: clear to auscultation bilaterally Heart: regular rate and rhythm and S1, S2 normal Abdomen: normal findings: bowel sounds normal and soft, non-tender Extremities:  no edema Skin: mobility and turgor normal Neurologic: Grossly normal  Consultants:  N/a  Procedures:  N/a  Data Reviewed: I have personally reviewed following labs and imaging studies Results for orders placed or performed during the hospital encounter of 02/08/21 (from the past 24 hour(s))  Basic metabolic panel     Status: Abnormal   Collection Time: 02/09/21  2:57 PM  Result Value Ref Range   Sodium 138 135 - 145 mmol/L   Potassium 3.9 3.5 - 5.1  mmol/L   Chloride 113 (H) 98 - 111 mmol/L   CO2 20 (L) 22 - 32 mmol/L   Glucose, Bld 121 (H) 70 - 99 mg/dL   BUN 8 6 - 20 mg/dL   Creatinine, Ser 04/11/21 0.44 - 1.00 mg/dL   Calcium 9.0 8.9 - 3.50 mg/dL   GFR, Estimated 09.3 >81 mL/min   Anion gap 5 5 - 15  Basic metabolic panel     Status: Abnormal   Collection Time: 02/09/21  8:45 PM  Result Value Ref Range   Sodium 137 135 - 145 mmol/L   Potassium 4.7 3.5 - 5.1 mmol/L   Chloride 110 98 - 111 mmol/L   CO2 22 22 - 32 mmol/L   Glucose, Bld 119 (H) 70 - 99 mg/dL   BUN 14 6 - 20 mg/dL   Creatinine, Ser 04/11/21 0.44 - 1.00 mg/dL   Calcium 8.6 (L) 8.9 - 10.3 mg/dL   GFR, Estimated 9.93 >71 mL/min   Anion gap 5 5 - 15  CBC with Differential/Platelet  Status: Abnormal   Collection Time: 02/10/21  5:35 AM  Result Value Ref Range   WBC 7.1 4.0 - 10.5 K/uL   RBC 3.77 (L) 3.87 - 5.11 MIL/uL   Hemoglobin 11.3 (L) 12.0 - 15.0 g/dL   HCT 14.4 (L) 31.5 - 40.0 %   MCV 89.7 80.0 - 100.0 fL   MCH 30.0 26.0 - 34.0 pg   MCHC 33.4 30.0 - 36.0 g/dL   RDW 86.7 61.9 - 50.9 %   Platelets 251 150 - 400 K/uL   nRBC 0.0 0.0 - 0.2 %   Neutrophils Relative % 69 %   Neutro Abs 4.8 1.7 - 7.7 K/uL   Lymphocytes Relative 24 %   Lymphs Abs 1.7 0.7 - 4.0 K/uL   Monocytes Relative 7 %   Monocytes Absolute 0.5 0.1 - 1.0 K/uL   Eosinophils Relative 0 %   Eosinophils Absolute 0.0 0.0 - 0.5 K/uL   Basophils Relative 0 %   Basophils Absolute 0.0 0.0 - 0.1 K/uL   Immature Granulocytes 0 %   Abs Immature Granulocytes 0.02 0.00 - 0.07 K/uL  Magnesium     Status: None   Collection Time: 02/10/21  5:35 AM  Result Value Ref Range   Magnesium 2.2 1.7 - 2.4 mg/dL  Basic metabolic panel     Status: Abnormal   Collection Time: 02/10/21  5:35 AM  Result Value Ref Range   Sodium 136 135 - 145 mmol/L   Potassium 3.8 3.5 - 5.1 mmol/L   Chloride 108 98 - 111 mmol/L   CO2 20 (L) 22 - 32 mmol/L   Glucose, Bld 109 (H) 70 - 99 mg/dL   BUN 12 6 - 20 mg/dL   Creatinine,  Ser 3.26 0.44 - 1.00 mg/dL   Calcium 9.1 8.9 - 71.2 mg/dL   GFR, Estimated >45 >80 mL/min   Anion gap 8 5 - 15    Recent Results (from the past 240 hour(s))  Resp Panel by RT-PCR (Flu A&B, Covid) Nasopharyngeal Swab     Status: Abnormal   Collection Time: 02/09/21  3:10 AM   Specimen: Nasopharyngeal Swab; Nasopharyngeal(NP) swabs in vial transport medium  Result Value Ref Range Status   SARS Coronavirus 2 by RT PCR POSITIVE (A) NEGATIVE Final    Comment: RESULT CALLED TO, READ BACK BY AND VERIFIED WITH: KYRA, RN @ 0410 ON 02/09/21 C VARNER (NOTE) SARS-CoV-2 target nucleic acids are DETECTED.  The SARS-CoV-2 RNA is generally detectable in upper respiratory specimens during the acute phase of infection. Positive results are indicative of the presence of the identified virus, but do not rule out bacterial infection or co-infection with other pathogens not detected by the test. Clinical correlation with patient history and other diagnostic information is necessary to determine patient infection status. The expected result is Negative.  Fact Sheet for Patients: BloggerCourse.com  Fact Sheet for Healthcare Providers: SeriousBroker.it  This test is not yet approved or cleared by the [place] FDA and  has been authorized for detection and/or diagnosis of SARS-CoV-2 by FDA under an Emergency Use Authorization (EUA).  This EUA will remain in effect (meaning this test can b e used) for the duration of  the COVID-19 declaration under Section 564(b)(1) of the Act, 21 U.S.C. section 360bbb-3(b)(1), unless the authorization is terminated or revoked sooner.     Influenza A by PCR NEGATIVE NEGATIVE Final   Influenza B by PCR NEGATIVE NEGATIVE Final    Comment: (NOTE) The Xpert Xpress SARS-CoV-2/FLU/RSV plus assay  is intended as an aid in the diagnosis of influenza from Nasopharyngeal swab specimens and should not be used as a sole basis  for treatment. Nasal washings and aspirates are unacceptable for Xpert Xpress SARS-CoV-2/FLU/RSV testing.  Fact Sheet for Patients: BloggerCourse.comhttps://www.fda.gov/media/152166/download  Fact Sheet for Healthcare Providers: SeriousBroker.ithttps://www.fda.gov/media/152162/download  This test is not yet approved or cleared by the [place] FDA and has been authorized for detection and/or diagnosis of SARS-CoV-2 by FDA under an Emergency Use Authorization (EUA). This EUA will remain in effect (meaning this test can be used) for the duration of the COVID-19 declaration under Section 564(b)(1) of the Act, 21 U.S.C. section 360bbb-3(b)(1), unless the authorization is terminated or revoked.  Performed at [facility], [address] [place], [place] [id number]      Radiology Studies: No results found. No orders to display    Scheduled Meds:  hydrocortisone sod succinate (SOLU-CORTEF) inj  50 mg Intravenous Q8H   levothyroxine  100 mcg Oral Daily   multivitamin with minerals  1 tablet Oral Daily   zinc sulfate  220 mg Oral Daily   PRN Meds: acetaminophen **OR** acetaminophen, LORazepam, ondansetron **OR** ondansetron (ZOFRAN) IV, oxyCODONE Continuous Infusions:     LOS: 1 day  Time spent: Greater than 50% of the 35 minute visit was spent in counseling/coordination of care for the patient as laid out in the A&P.   Lewie Chamberavid Neil Errickson, MD [facility] 02/10/2021, 2:36 PM

## 2021-02-11 LAB — BASIC METABOLIC PANEL
Anion gap: 8 (ref 5–15)
BUN: 12 mg/dL (ref 6–20)
CO2: 24 mmol/L (ref 22–32)
Calcium: 9.5 mg/dL (ref 8.9–10.3)
Chloride: 107 mmol/L (ref 98–111)
Creatinine, Ser: 0.73 mg/dL (ref 0.44–1.00)
GFR, Estimated: >60 mL/min (ref >60)
Glucose, Bld: 106 mg/dL — ABNORMAL HIGH (ref 70–99)
Potassium: 3.8 mmol/L (ref 3.5–5.1)
Sodium: 139 mmol/L (ref 135–145)

## 2021-02-11 LAB — CBC WITH DIFFERENTIAL/PLATELET
Abs Immature Granulocytes: 0.04 10*3/uL (ref 0.00–0.07)
Basophils Absolute: 0 10*3/uL (ref 0.0–0.1)
Basophils Relative: 0 %
Eosinophils Absolute: 0 10*3/uL (ref 0.0–0.5)
Eosinophils Relative: 0 %
HCT: 35 % — ABNORMAL LOW (ref 36.0–46.0)
Hemoglobin: 11.8 g/dL — ABNORMAL LOW (ref 12.0–15.0)
Immature Granulocytes: 1 %
Lymphocytes Relative: 24 %
Lymphs Abs: 1.9 10*3/uL (ref 0.7–4.0)
MCH: 30.3 pg (ref 26.0–34.0)
MCHC: 33.7 g/dL (ref 30.0–36.0)
MCV: 90 fL (ref 80.0–100.0)
Monocytes Absolute: 0.4 10*3/uL (ref 0.1–1.0)
Monocytes Relative: 5 %
Neutro Abs: 5.6 10*3/uL (ref 1.7–7.7)
Neutrophils Relative %: 70 %
Platelets: 283 10*3/uL (ref 150–400)
RBC: 3.89 MIL/uL (ref 3.87–5.11)
RDW: 13.3 % (ref 11.5–15.5)
WBC: 7.9 10*3/uL (ref 4.0–10.5)
nRBC: 0 % (ref 0.0–0.2)

## 2021-02-11 LAB — MAGNESIUM: Magnesium: 2.1 mg/dL (ref 1.7–2.4)

## 2021-02-11 LAB — HEMOGLOBIN A1C
Hgb A1c MFr Bld: 5.7 % — ABNORMAL HIGH (ref 4.8–5.6)
Mean Plasma Glucose: 117 mg/dL

## 2021-02-11 MED ORDER — HYDROCORTISONE 10 MG PO TABS: ORAL_TABLET | ORAL | Status: AC

## 2021-02-11 NOTE — Progress Notes (Signed)
Patient Discharged to home with family, discharge instructions reviewed with patient who verbalized understanding. No new medications.

## 2021-02-11 NOTE — Discharge Summary (Signed)
Physician Discharge Summary   Michelle Casey WGN:[id number] DOB: 02-02-1961 DOA: 02/08/2021  PCP: Daisy Floro, MD  Admit date: 02/08/2021 Discharge date: 02/11/2021   Admitted From: home Disposition:  home Discharging physician: Lewie Chamber, MD  Recommendations for Outpatient Follow-up:  Follow up with endocrinology  Home Health:  Equipment/Devices:   Patient discharged to home in Discharge Condition: stable Risk of unplanned readmission score: Unplanned Admission- Pilot do not use: 11.51  CODE STATUS: Full Diet recommendation:  Diet Orders (From admission, onward)     Start     Ordered   02/11/21 0000  Diet general        02/11/21 1130   02/09/21 1007  Diet regular Room service appropriate? Yes; Fluid consistency: Thin  Diet effective now       Question Answer Comment  Room service appropriate? Yes   Fluid consistency: Thin      02/09/21 1006            Hospital Course: Michelle Casey is a 60 y.o. female,with history of autoimmune thyroiditis, recently diagnosed with addison's disease presents to the ED with a chief complaint of dizziness and nausea.  Patient was recently admitted on Michelle Casey and discharged on Michelle Casey after hospitalization for hyponatremia.  At that time patient initially presented because she had a COVID exposure and was experiencing mild COVID symptoms.  She was diagnosed with COVID but found to have hyponatremia at 114.  Patient complained of shortness of breath, nausea, weakness at that time.  She did have a fever during her hospital course.  She finished a course remdesivir, her hyponatremia was treated, and patient was advised to follow-up closely with endocrinology.   She followed up with endocrinology on Monday, June 6.  She reports that she had an ACTH stimulation test and was diagnosed with Addison's disease.  Upon getting his phone call she became very anxious, and her stress levels have remained high since.  She was told to take hydrocortisone 10 mg in  the a.m. and 5 mg at 2 PM.  Patient reports that she has been doing this for 2 days PTA.  She began to feel progressively worse at home and was recommended to present to the ER by her endocrinology office.  Upon arrival to the ER patient had nausea and vomiting. She reports that she has been so weak that she could barely open her eyes so she is not sure exactly what it looks like.  She endorses abdominal pain in her right lower quadrant.  She had normal bowel movements today.  Upon being discharged from the hospital on Shavon Casey she did have some diarrhea but that has resolved.  Patient reports an increase in urine output, no dysuria, no hematuria.  She denies any rashes or skin lesions.  She does report that when she is nauseous she has a chest heaviness, and palpitations.  She endorses full body tremors, dyspnea, 1 coughing episode productive of white mucus, and a chronically blue tongue for 2 years.  She denies any fevers.  Patient has no other complaints at this time.   In the ED Afebrile with a temperature of 98.2, heart rate 56-59, respiratory rate 23, blood pressure recorded was 94/52 and trended lower some. Satting 100% No leukocytosis with a white blood cell count of 5.9, hemoglobin 12.7 Chemistry panel reveals a hyponatremia 121, low normal potassium at 3.7, hypochloremia at 91, decreased bicarb at 19 EKG shows sinus rhythm with a rate of 64, QTc 465 Patient  was given 1 L of normal saline in the ED, and Zofran  * Addisonian crisis (HCC) - Hypotension, hyponatremia, hypoglycemia.  Recent diagnosis of Addison's disease via stim test outpatient at her endocrinology office ([name]) - started on stress dose hydrocortisone; continue for now; decrease and will send on adjusted home dose at discharge (hydrocortisone Casey mg in am x 3 days and 10 mg pm, and patient will follow up with endocrinology on 6/13 for further adjustment) - she has outpatient follow up on Monday with [name] as well -  negative orthostatic BP prior to discharge; BP holding stable and glucose/Na also improved and stable  Hyponatremia-resolved as of 02/10/2021 - Na 121 on admission -Improved and corrected with fluids and steroids - Na 139 at discharge and had been holding steady  Hypotension - s/p fluids and stress dose steroids - See addisonian crisis  Autoimmune thyroiditis - continue synthroid - TSH 0.481 on 02/09/21  COVID-19 virus infection-resolved as of 02/10/2021 - Diagnosed last hospitalization - Previously completed course of remdesivir.  Currently no respiratory symptoms no hypoxia - Isolation discontinued as she is out of 21-day window - No further work-up or treatment indicated    The patient's chronic medical conditions were treated accordingly per the patient's home medication regimen except as noted.  On day of discharge, patient was felt deemed stable for discharge. Patient/family member advised to call PCP or come back to ER if needed.   Principal Diagnosis: Addisonian crisis [facility])  Discharge Diagnoses: Active Hospital Problems   Diagnosis Date Noted   Addisonian crisis (HCC) 02/09/2021    Priority: High   Hypotension 02/09/2021    Priority: Medium   Autoimmune thyroiditis 05/Casey/2022    Priority: Medium    Resolved Hospital Problems   Diagnosis Date Noted Date Resolved   Hyponatremia 05/Casey/2022 02/10/2021    Priority: High   COVID-19 virus infection 05/Casey/2022 02/10/2021    Priority: Low    Discharge Instructions     Diet general   Complete by: As directed    Increase activity slowly   Complete by: As directed       Allergies as of 02/11/2021       Reactions   Carbimazole Itching, Rash        Medication List     TAKE these medications    hydrocortisone 10 MG tablet Commonly known as: CORTEF Start taking Casey mg in the morning and 10 mg around 2pm until follow up with endocrine on 02/13/21 What changed:  how much to take how to take this when to take  this additional instructions   levothyroxine 100 MCG tablet Commonly known as: SYNTHROID Take 100 mcg by mouth daily.        Allergies  Allergen Reactions   Carbimazole Itching and Rash    Consultations:   Discharge Exam: BP 100/62   Pulse 77   Temp 98.1 F (36.7 C) (Oral)   Resp 18   Ht 5\' 6"  (1.676 m)   Wt 56.7 kg   SpO2 98%   BMI Casey.18 kg/m  General appearance: alert, cooperative, and no distress Head: Normocephalic, without obvious abnormality, atraumatic Eyes:  EOMI Lungs: clear to auscultation bilaterally Heart: regular rate and rhythm and S1, S2 normal Abdomen: normal findings: bowel sounds normal and soft, non-tender Extremities:  no edema Skin: mobility and turgor normal Neurologic: Grossly normal  The results of significant diagnostics from this hospitalization (including imaging, microbiology, ancillary and laboratory) are listed below for reference.   Microbiology:  Recent Results (from the past 240 hour(s))  Resp Panel by RT-PCR (Flu A&B, Covid) Nasopharyngeal Swab     Status: Abnormal   Collection Time: 02/09/21  3:10 AM   Specimen: Nasopharyngeal Swab; Nasopharyngeal(NP) swabs in vial transport medium  Result Value Ref Range Status   SARS Coronavirus 2 by RT PCR POSITIVE (A) NEGATIVE Final    Comment: RESULT CALLED TO, READ BACK BY AND VERIFIED WITH: KYRA, RN @ 0410 ON 02/09/21 C VARNER (NOTE) SARS-CoV-2 target nucleic acids are DETECTED.  The SARS-CoV-2 RNA is generally detectable in upper respiratory specimens during the acute phase of infection. Positive results are indicative of the presence of the identified virus, but do not rule out bacterial infection or co-infection with other pathogens not detected by the test. Clinical correlation with patient history and other diagnostic information is necessary to determine patient infection status. The expected result is Negative.  Fact Sheet for  Patients: BloggerCourse.com  Fact Sheet for Healthcare Providers: SeriousBroker.it  This test is not yet approved or cleared by the [place] FDA and  has been authorized for detection and/or diagnosis of SARS-CoV-2 by FDA under an Emergency Use Authorization (EUA).  This EUA will remain in effect (meaning this test can b e used) for the duration of  the COVID-19 declaration under Section 564(b)(1) of the Act, 21 U.S.C. section 360bbb-3(b)(1), unless the authorization is terminated or revoked sooner.     Influenza A by PCR NEGATIVE NEGATIVE Final   Influenza B by PCR NEGATIVE NEGATIVE Final    Comment: (NOTE) The Xpert Xpress SARS-CoV-2/FLU/RSV plus assay is intended as an aid in the diagnosis of influenza from Nasopharyngeal swab specimens and should not be used as a sole basis for treatment. Nasal washings and aspirates are unacceptable for Xpert Xpress SARS-CoV-2/FLU/RSV testing.  Fact Sheet for Patients: BloggerCourse.com  Fact Sheet for Healthcare Providers: SeriousBroker.it  This test is not yet approved or cleared by the [place] FDA and has been authorized for detection and/or diagnosis of SARS-CoV-2 by FDA under an Emergency Use Authorization (EUA). This EUA will remain in effect (meaning this test can be used) for the duration of the COVID-19 declaration under Section 564(b)(1) of the Act, 21 U.S.C. section 360bbb-3(b)(1), unless the authorization is terminated or revoked.  Performed at [facility], [address] [place], [place] [address]      Labs: BNP (last 3 results) No results for input(s): BNP in the last 8760 hours. Basic Metabolic Panel: Recent Labs  Lab 02/09/21 0330 02/09/21 1030 02/09/21 1457 02/09/21 2045 02/10/21 0535 02/11/21 0635  NA 123*  --  138 137 136 139  K 3.7  --  3.9 4.7 3.8 3.8  CL 98  --  113* 110  108 107  CO2 17*  --  Casey* 22 Casey* Casey  GLUCOSE 107*  --  121* 119* 109* 106*  BUN 8  --  8 14 12 12   CREATININE 0.56  --  0.63 0.98 0.56 0.73  CALCIUM 8.8*  --  9.0 8.6* 9.1 9.5  MG  --  2.1  --   --  2.2 2.1   Liver Function Tests: No results for input(s): AST, ALT, ALKPHOS, BILITOT, PROT, ALBUMIN in the last 168 hours. No results for input(s): LIPASE, AMYLASE in the last 168 hours. No results for input(s): AMMONIA in the last 168 hours. CBC: Recent Labs  Lab 02/08/21 2309 02/10/21 0535 02/11/21 0635  WBC 5.9 7.1 7.9  NEUTROABS  --  4.8  5.6  HGB 12.7 11.3* 11.8*  HCT 36.3 33.8* 35.0*  MCV 85.0 89.7 90.0  PLT 267 251 283   Cardiac Enzymes: No results for input(s): CKTOTAL, CKMB, CKMBINDEX, TROPONINI in the last 168 hours. BNP: Invalid input(s): POCBNP CBG: Recent Labs  Lab 02/08/21 2335  GLUCAP 104*   D-Dimer No results for input(s): DDIMER in the last 72 hours. Hgb A1c Recent Labs    02/10/21 0535  HGBA1C 5.7*   Lipid Profile No results for input(s): CHOL, HDL, LDLCALC, TRIG, CHOLHDL, LDLDIRECT in the last 72 hours. Thyroid function studies Recent Labs    02/09/21 0200  TSH 0.481   Anemia work up No results for input(s): VITAMINB12, FOLATE, FERRITIN, TIBC, IRON, RETICCTPCT in the last 72 hours. Urinalysis    Component Value Date/Time   COLORURINE STRAW (A) 02/09/2021 0240   APPEARANCEUR CLEAR 02/09/2021 0240   LABSPEC 1.006 02/09/2021 0240   PHURINE 7.0 02/09/2021 0240   GLUCOSEU NEGATIVE 02/09/2021 0240   HGBUR NEGATIVE 02/09/2021 0240   BILIRUBINUR NEGATIVE 02/09/2021 0240   KETONESUR Casey (A) 02/09/2021 0240   PROTEINUR NEGATIVE 02/09/2021 0240   NITRITE NEGATIVE 02/09/2021 0240   LEUKOCYTESUR NEGATIVE 02/09/2021 0240   Sepsis Labs Invalid input(s): PROCALCITONIN,  WBC,  LACTICIDVEN Microbiology Recent Results (from the past 240 hour(s))  Resp Panel by RT-PCR (Flu A&B, Covid) Nasopharyngeal Swab     Status: Abnormal   Collection Time: 02/09/21   3:10 AM   Specimen: Nasopharyngeal Swab; Nasopharyngeal(NP) swabs in vial transport medium  Result Value Ref Range Status   SARS Coronavirus 2 by RT PCR POSITIVE (A) NEGATIVE Final    Comment: RESULT CALLED TO, READ BACK BY AND VERIFIED WITH: KYRA, RN @ 0410 ON 02/09/21 C VARNER (NOTE) SARS-CoV-2 target nucleic acids are DETECTED.  The SARS-CoV-2 RNA is generally detectable in upper respiratory specimens during the acute phase of infection. Positive results are indicative of the presence of the identified virus, but do not rule out bacterial infection or co-infection with other pathogens not detected by the test. Clinical correlation with patient history and other diagnostic information is necessary to determine patient infection status. The expected result is Negative.  Fact Sheet for Patients: BloggerCourse.comhttps://www.fda.gov/media/152166/download  Fact Sheet for Healthcare Providers: SeriousBroker.ithttps://www.fda.gov/media/152162/download  This test is not yet approved or cleared by the [place] FDA and  has been authorized for detection and/or diagnosis of SARS-CoV-2 by FDA under an Emergency Use Authorization (EUA).  This EUA will remain in effect (meaning this test can b e used) for the duration of  the COVID-19 declaration under Section 564(b)(1) of the Act, 21 U.S.C. section 360bbb-3(b)(1), unless the authorization is terminated or revoked sooner.     Influenza A by PCR NEGATIVE NEGATIVE Final   Influenza B by PCR NEGATIVE NEGATIVE Final    Comment: (NOTE) The Xpert Xpress SARS-CoV-2/FLU/RSV plus assay is intended as an aid in the diagnosis of influenza from Nasopharyngeal swab specimens and should not be used as a sole basis for treatment. Nasal washings and aspirates are unacceptable for Xpert Xpress SARS-CoV-2/FLU/RSV testing.  Fact Sheet for Patients: BloggerCourse.comhttps://www.fda.gov/media/152166/download  Fact Sheet for Healthcare Providers: SeriousBroker.ithttps://www.fda.gov/media/152162/download  This test  is not yet approved or cleared by the [place] FDA and has been authorized for detection and/or diagnosis of SARS-CoV-2 by FDA under an Emergency Use Authorization (EUA). This EUA will remain in effect (meaning this test can be used) for the duration of the COVID-19 declaration under Section 564(b)(1) of the Act, 21 U.S.C. section 360bbb-3(b)(1), unless  the authorization is terminated or revoked.  Performed at [facility], [address] [place], [place] [address]     Procedures/Studies: DG Chest Port 1 View  Result Date: 5/Casey/2022 CLINICAL DATA:  cough EXAM: PORTABLE CHEST 1 VIEW COMPARISON:  None. FINDINGS: The heart size and mediastinal contours are within normal limits. No focal consolidation. No visible pleural effusion or pneumothorax. The visualized skeletal structures are unremarkable. IMPRESSION: No acute cardiopulmonary disease. Electronically Signed   By: Maudry Mayhew MD   On: 05/Casey/2022 02:56     Time coordinating discharge: Over 30 minutes    Lewie Chamber, MD  [facility] 02/11/2021, 3:04 PM

## 2021-02-13 ENCOUNTER — Ambulatory Visit
Admission: RE | Admit: 2021-02-13 | Discharge: 2021-02-13 | Disposition: A | Discharge status: Home / Self Care | Payer: Managed Care, Other (non HMO) | Source: Ambulatory Visit | Attending: Family Medicine | Admitting: Family Medicine

## 2021-02-13 ENCOUNTER — Other Ambulatory Visit: Payer: Self-pay

## 2021-02-13 ENCOUNTER — Other Ambulatory Visit: Payer: Self-pay | Admitting: Internal Medicine

## 2021-02-13 ENCOUNTER — Ambulatory Visit
Admission: RE | Admit: 2021-02-13 | Discharge: 2021-02-13 | Disposition: A | Discharge status: Home / Self Care | Payer: Managed Care, Other (non HMO) | Source: Ambulatory Visit | Attending: Internal Medicine | Admitting: Internal Medicine

## 2021-02-13 DIAGNOSIS — R1031 Right lower quadrant pain: Secondary | ICD-10-CM

## 2021-02-13 DIAGNOSIS — M79671 Pain in right foot: Secondary | ICD-10-CM

## 2021-02-13 DIAGNOSIS — G8929 Other chronic pain: Secondary | ICD-10-CM

## 2021-02-16 ENCOUNTER — Other Ambulatory Visit: Payer: PRIVATE HEALTH INSURANCE

## 2022-09-20 IMAGING — DX DG CHEST 1V PORT
1 series · 1 of 1 positions shown · non-contrast
Comparison: None.

CLINICAL DATA: cough

EXAM:
PORTABLE CHEST 1 VIEW

[chest ap]
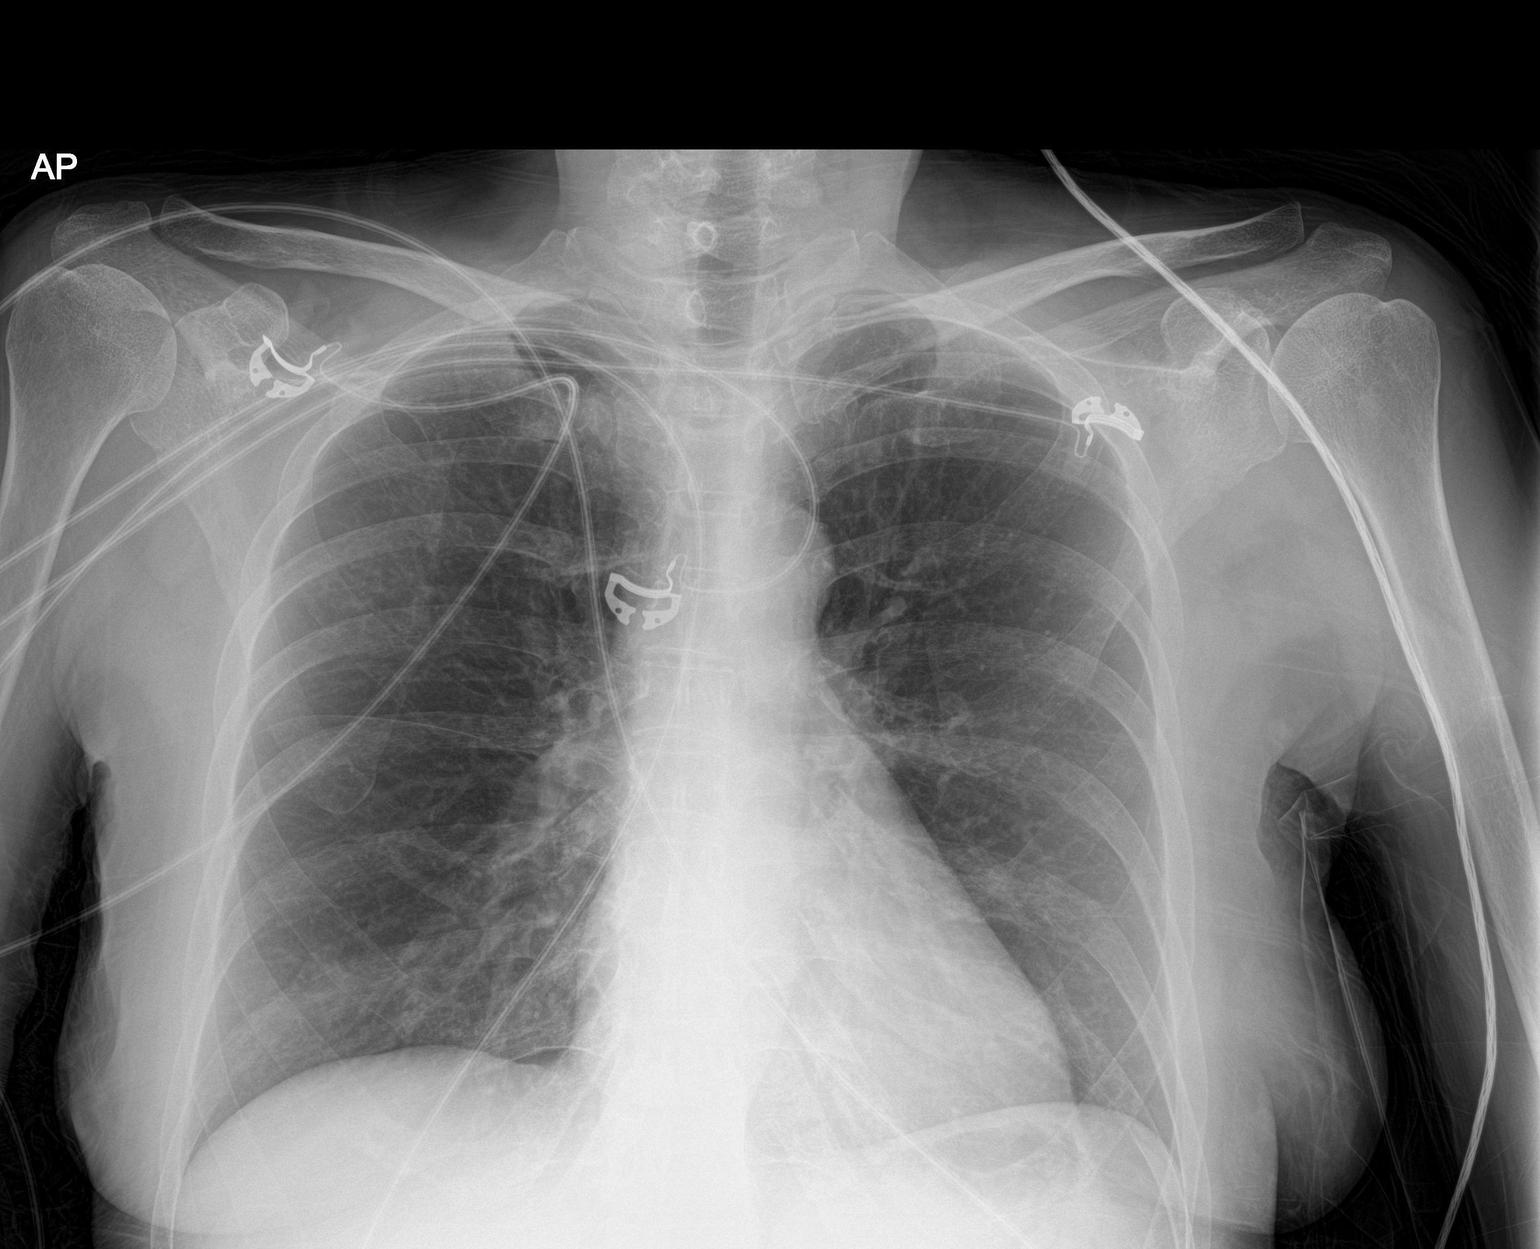

[1 of 1 positions shown; findings below may reference images not displayed]

FINDINGS: The heart size and mediastinal contours are within normal limits. No
focal consolidation. No visible pleural effusion or pneumothorax.
The visualized skeletal structures are unremarkable.
IMPRESSION: No acute cardiopulmonary disease.

## 2022-10-14 IMAGING — CT CT ABD-PELV W/O CM
1 of 2 series · 14 of 32 positions shown, 19 images · non-contrast
Comparison: None.

CLINICAL DATA: Right lower quadrant pain for 1 month, initial
encounter

EXAM:
CT ABDOMEN AND PELVIS WITHOUT CONTRAST
TECHNIQUE: Multidetector CT imaging of the abdomen and pelvis was performed
following the standard protocol without IV contrast.

[Series 2: abd/pelvis w/(date) · axial · 0.70mm/px · z∈[-474,-64]mm · 14 of 92 slices shown, 19 images]
[im 5/92  soft-tissue]
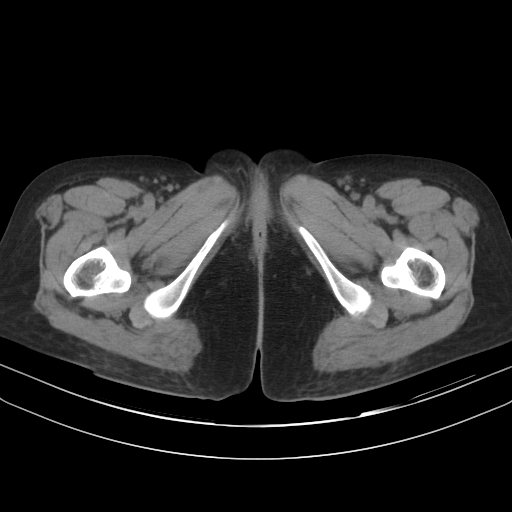
[im 5/92  bone]
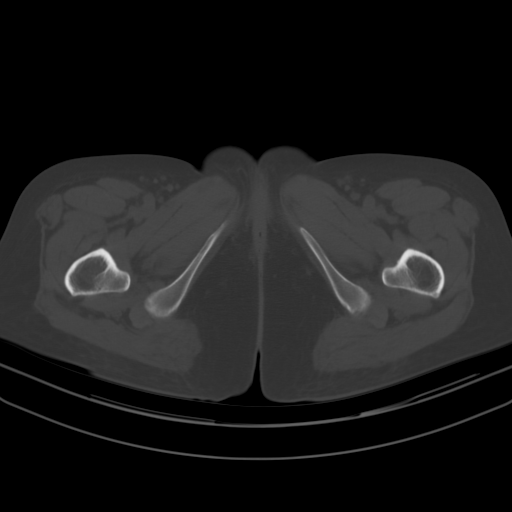
[im 14/92  soft-tissue]
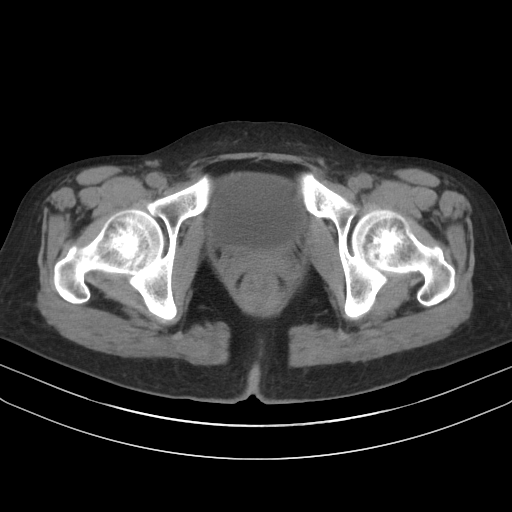
[im 19/92  soft-tissue]
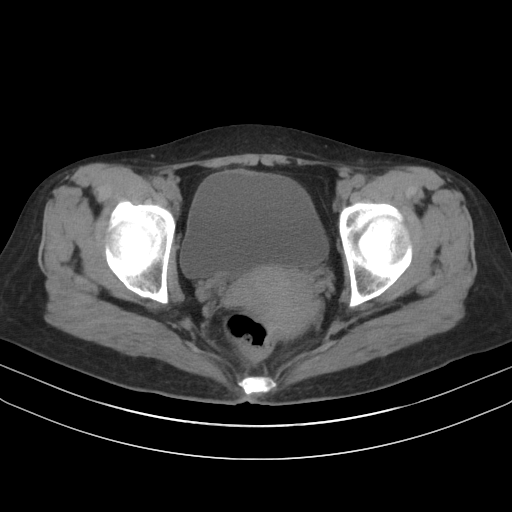
[im 28/92  soft-tissue]
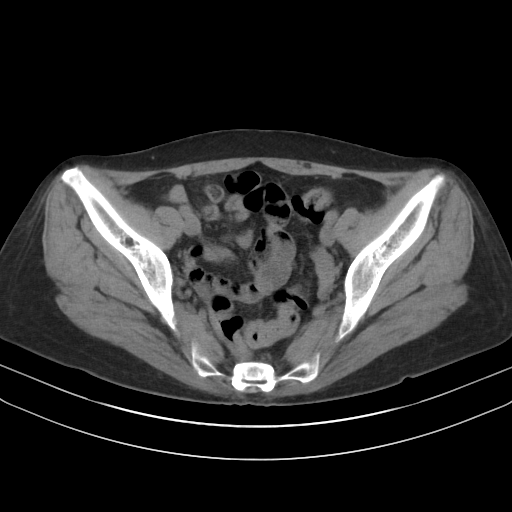
[im 32/92  soft-tissue]
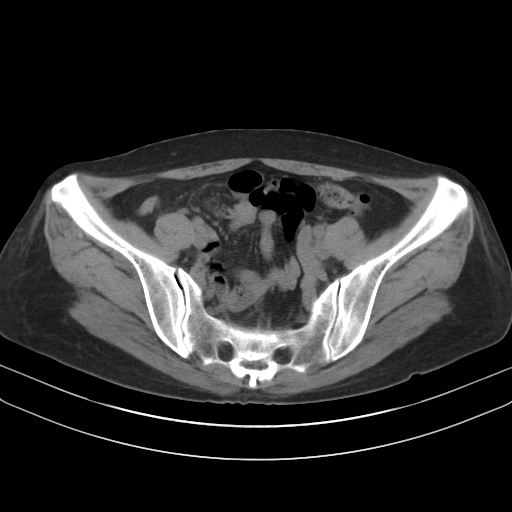
[im 41/92  soft-tissue]
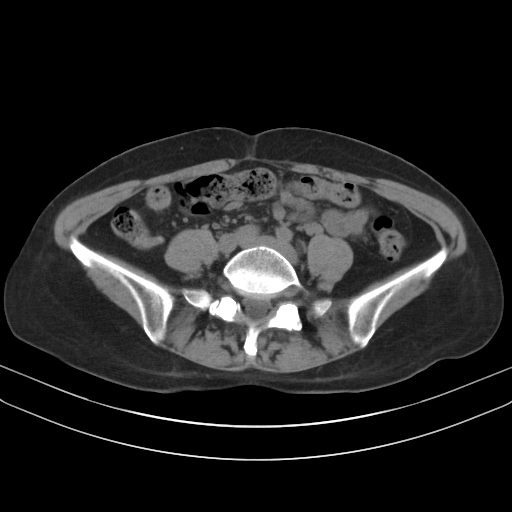
[im 46/92  soft-tissue]
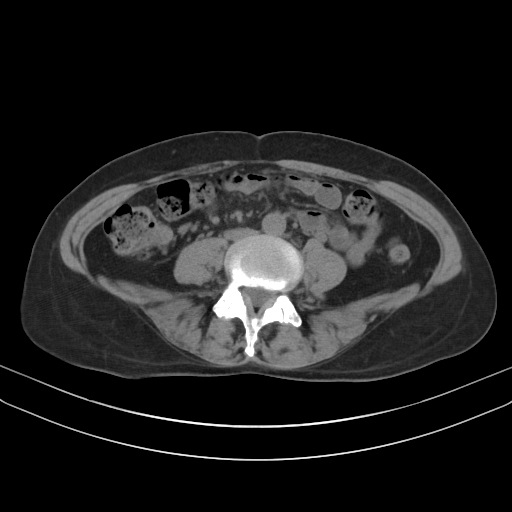
[im 51/92  soft-tissue]
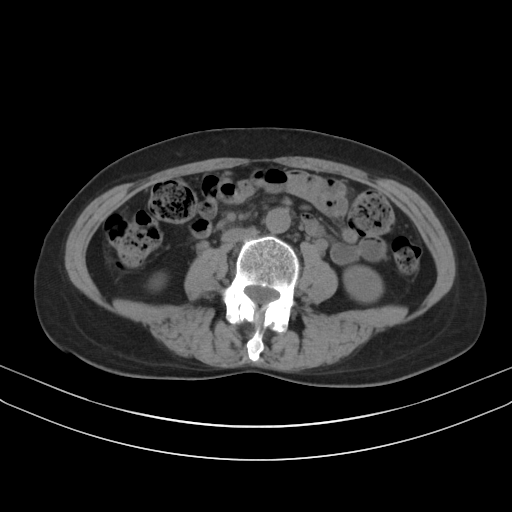
[im 60/92  soft-tissue]
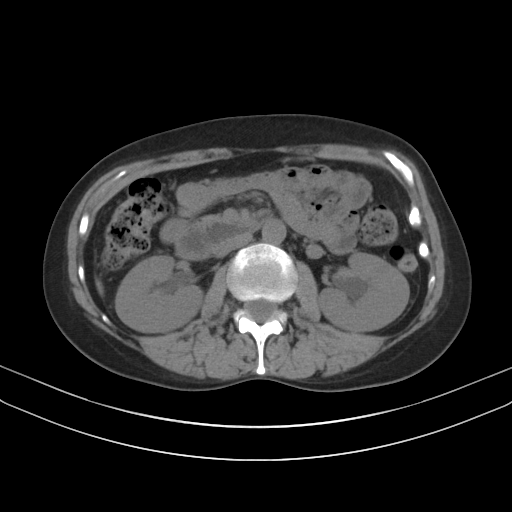
[im 60/92  bone]
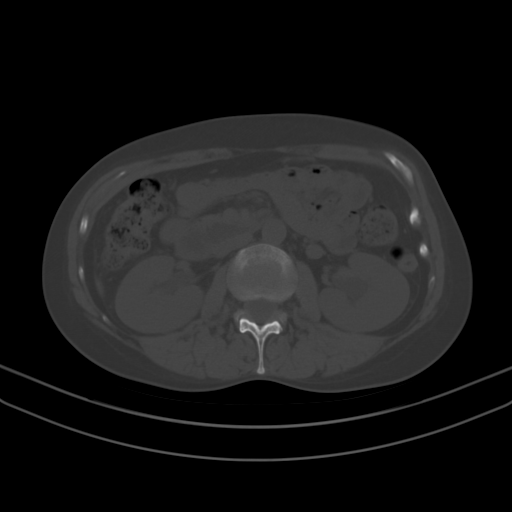
[im 64/92  soft-tissue]
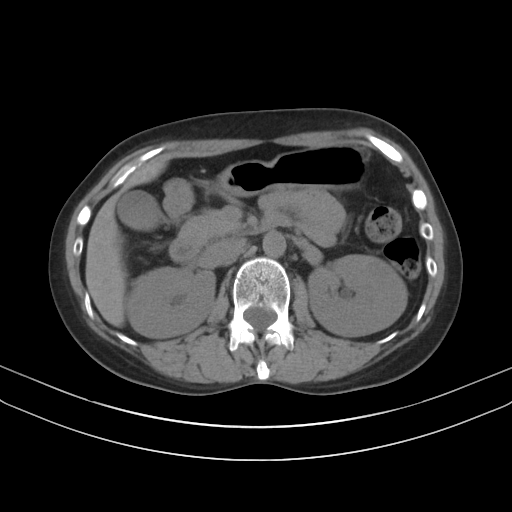
[im 73/92  soft-tissue]
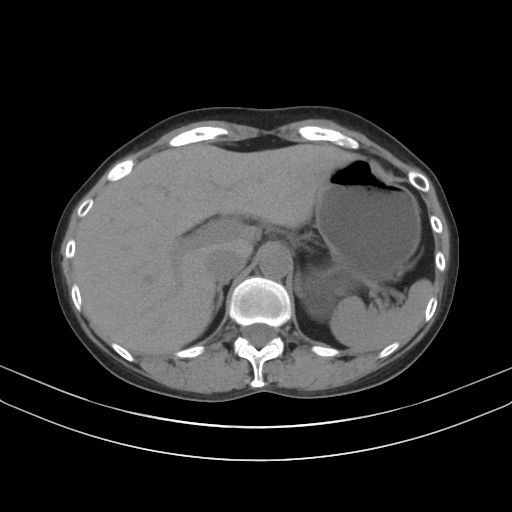
[im 73/92  lung]
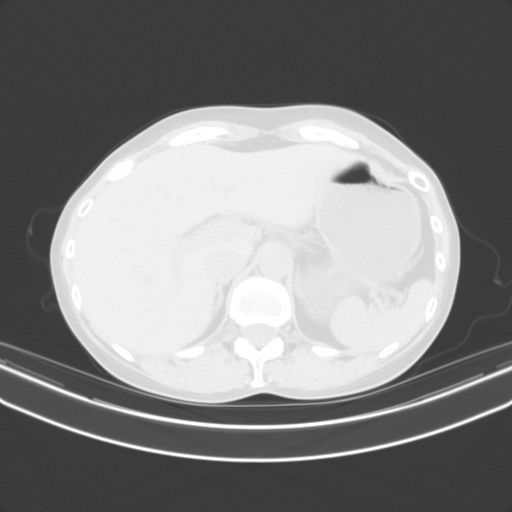
[im 78/92  soft-tissue]
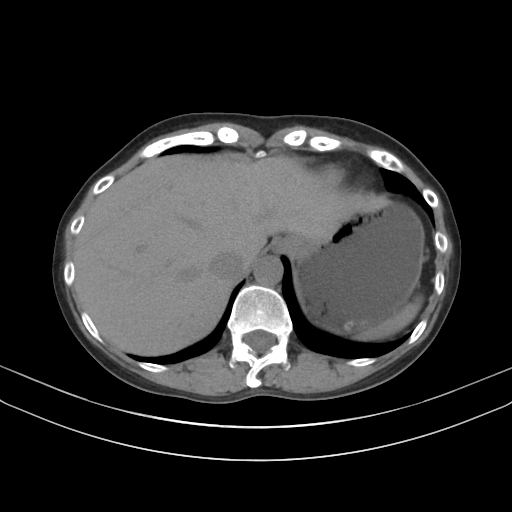
[im 78/92  lung]
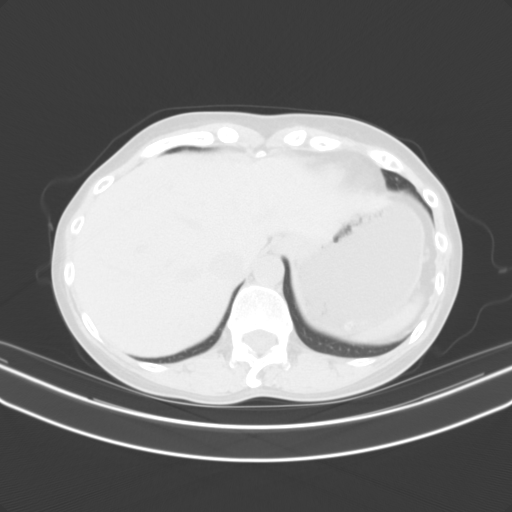
[im 82/92  lung]
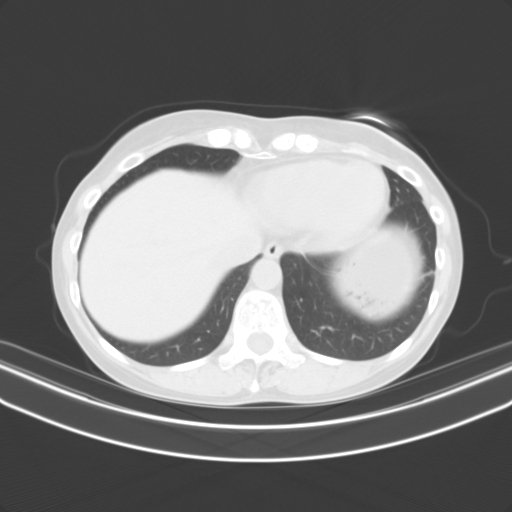
[im 87/92  soft-tissue]
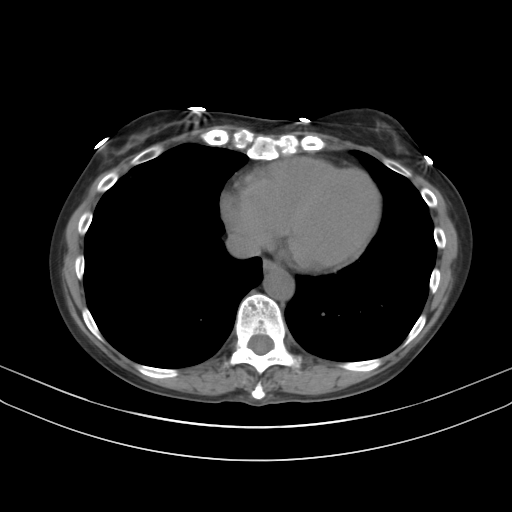
[im 87/92  lung]
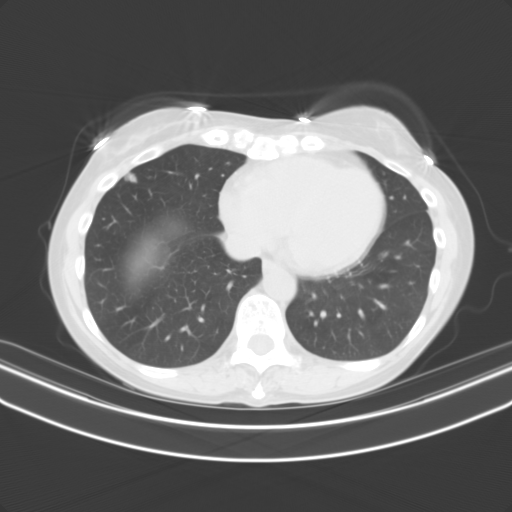

[14 of 32 positions shown; findings below may reference images not displayed]

FINDINGS: Lower chest: No acute abnormality.

Hepatobiliary: No focal liver abnormality is seen. No gallstones,
gallbladder wall thickening, or biliary dilatation.

Pancreas: Unremarkable. No pancreatic ductal dilatation or
surrounding inflammatory changes.

Spleen: Normal in size without focal abnormality.

Adrenals/Urinary Tract: Adrenal glands are within normal limits.
Kidneys are well visualized without evidence of obstructive change
or renal calculi. No ureteral calculi are seen. The bladder is well
distended.

Stomach/Bowel: No obstructive or inflammatory changes of the colon
are noted. The appendix is within normal limits. No inflammatory
changes are seen. The small bowel and stomach are unremarkable.

Vascular/Lymphatic: No significant vascular findings are present. No
enlarged abdominal or pelvic lymph nodes.

Reproductive: Uterus is retroflexed but otherwise within normal
limits. No adnexal mass is noted.

Other: No abdominal wall hernia or abnormality. No abdominopelvic
ascites.

Musculoskeletal: No acute or significant osseous findings.
IMPRESSION: No acute abnormality noted.
